# Patient Record
Sex: Female | Born: 1960 | Race: White | Hispanic: No | Marital: Married | Smoking: Former smoker
Health system: Southern US, Community
[De-identification: ages and names within clinical notes are randomized; demographics above are authoritative.]

## PROBLEM LIST (undated history)

## (undated) DIAGNOSIS — M069 Rheumatoid arthritis, unspecified: Secondary | ICD-10-CM

## (undated) HISTORY — DX: Rheumatoid arthritis, unspecified: M06.9

---

## 1986-08-25 HISTORY — PX: BREAST LUMPECTOMY: SHX2

## 2004-07-03 ENCOUNTER — Ambulatory Visit: Payer: Self-pay | Admitting: Family Medicine

## 2015-07-03 ENCOUNTER — Ambulatory Visit: Payer: BLUE CROSS/BLUE SHIELD | Attending: Rheumatology

## 2015-07-03 DIAGNOSIS — M069 Rheumatoid arthritis, unspecified: Secondary | ICD-10-CM

## 2015-07-03 DIAGNOSIS — G894 Chronic pain syndrome: Secondary | ICD-10-CM | POA: Insufficient documentation

## 2015-07-03 NOTE — Patient Instructions (Signed)
She was asked to  not over exert for the next couple of days but to stay active

## 2015-07-03 NOTE — Therapy (Signed)
[place], [place], [address] Phone: [phone]   Fax:  [phone]  Physical Therapy Evaluation  Patient Details  Name: Natalie Osborn MRN: [id number] Date of Birth: 10-05-1960 No Data Recorded  Encounter Date: 07/03/2015      PT End of Session - 07/03/15 1723    Visit Number 1   Number of Visits 1   PT Start Time 0132   PT Stop Time 0510   PT Time Calculation (min) 218 min   Activity Tolerance Patient limited by pain;Patient tolerated treatment well   Behavior During Therapy [facility] for tasks assessed/performed      No past medical history on file.  No past surgical history on file.  There were no vitals filed for this visit.  Visit Diagnosis:  Chronic pain syndrome - Plan: PT plan of care cert/re-cert  Rheumatoid arthritis involving both knees, unspecified rheumatoid factor presence ([place]) - Plan: PT plan of care cert/re-cert  Rheumatoid arthritis involving both hands, unspecified rheumatoid factor presence ([place]) - Plan: PT plan of care cert/re-cert      Subjective Assessment - 07/03/15 1728    Subjective See FCE report   Currently in Pain? Yes  See FCE report   Multiple Pain Sites Yes  See FCE report                                           Plan - 07/03/15 1727    Clinical Impression Statement Ms Dishman completed the FCE process with work rating of SEDENTARY. Report to be faxed to Dr Vickey Huger and Agree with Plan of Care Patient         Problem List There are no active problems to display for this patient.   Darrel Hoover PT 07/03/2015, 5:32 PM  [facility] [address] [place], [place], [address] Phone: [phone]   Fax:  [phone]  Name: Natalie Osborn MRN: [id number] Date of Birth: 1960-09-25

## 2016-04-23 ENCOUNTER — Other Ambulatory Visit: Payer: Self-pay | Admitting: Rheumatology

## 2016-04-23 DIAGNOSIS — R911 Solitary pulmonary nodule: Secondary | ICD-10-CM

## 2016-05-01 ENCOUNTER — Ambulatory Visit
Admission: RE | Admit: 2016-05-01 | Discharge: 2016-05-01 | Disposition: A | Discharge status: Home / Self Care | Payer: BLUE CROSS/BLUE SHIELD | Source: Ambulatory Visit | Attending: Rheumatology | Admitting: Rheumatology

## 2016-05-01 DIAGNOSIS — R911 Solitary pulmonary nodule: Secondary | ICD-10-CM

## 2016-05-06 ENCOUNTER — Encounter: Payer: Self-pay | Admitting: Pulmonary Disease

## 2016-05-06 ENCOUNTER — Ambulatory Visit (INDEPENDENT_AMBULATORY_CARE_PROVIDER_SITE_OTHER): Payer: BLUE CROSS/BLUE SHIELD | Admitting: Pulmonary Disease

## 2016-05-06 DIAGNOSIS — R918 Other nonspecific abnormal finding of lung field: Secondary | ICD-10-CM | POA: Diagnosis not present

## 2016-05-06 NOTE — Assessment & Plan Note (Signed)
Clotine and I reviewed the images of her CT chest today which showed multiple pulmonary nodules, the largest of which was 6 mm in the right lower lobe.  The differential diagnosis of nodules is broad and includes many different benign and malignant etiologies. The majority of nodules that are less than a centimeter, round and smooth like hers are in fact benign so hopefully that will be the case for her. However, she did smoke for a number of years so her likelihood of having lung cancer is slightly elevated.  I think this most likely represents an atypical infection of some sort or a result of a prior infection. She tells me that she's had multiple TB test collected in the past which have been negative.  Plan: Repeat CT scan of the chest in 6 months and follow up afterwards Come back to clini soonerc if she develops any sort of respiratory complaints like chest congestion, hemoptysis, weight loss, or chest pain.

## 2016-05-06 NOTE — Progress Notes (Signed)
Subjective:    Patient ID: Natalie Osborn, female    DOB: 23-Jul-1961, 55 y.o.   MRN: [id number]  HPI Chief Complaint  Patient presents with  . Advice Only    referred by Dr. Dossie Der for pulm nodule.     [name] was sent to me to evaluate a pulmonary nodule.  She was recently being seen for evaluation for a clinical trial for her rhematoid arthritis and she ended up having a CXR that showed a nodule.    She has never been told she had a lung problem in adulthood or childhood.  She has had some hoarseness which she thinks is related to recurrent allergic rhinitis.  She notes some mucus in her throat.  She has coughed some mucus in the last year, in the mornings primarily.  She used to work in a [employer].    She doesn't exercise but she notes that she has no dyspnea.  In the last year her rheumatoid arthritis have been severe despite weekly Humira injections.  Specifically swelling and soreness of her knees and feet and hands.  She had a lot of fatigue and chills.  This was most severe in the late evenings.    She has smoked as much as 1PPD, quit over 20 years ago.  She started smoking in her 40's and quit in her 46's.    Past Medical History:  Diagnosis Date  . Rheumatoid arthritis ([place])      Family History  Problem Relation Age of Onset  . Diabetes Mother   . Cancer Father   . Other Brother     Crypto Meningitis     Social History   Social History  . Marital status: Married    Spouse name: N/A  . Number of children: N/A  . Years of education: N/A   Occupational History  . Not on file.   Social History Main Topics  . Smoking status: Former Smoker    Packs/day: 1.00    Years: 7.00    Types: Cigarettes    Quit date: 05/06/1994  . Smokeless tobacco: Never Used  . Alcohol use Not on file  . Drug use: Unknown  . Sexual activity: Not on file   Other Topics Concern  . Not on file   Social History Narrative  . No narrative on file     No Known Allergies    Outpatient Medications Prior to Visit  Medication Sig Dispense Refill  . calcium carbonate 1250 MG capsule Take 1,250 mg by mouth 2 (two) times daily with a meal.    . ferrous fumarate (HEMOCYTE - 106 MG FE) 325 (106 FE) MG TABS tablet Take 1 tablet by mouth.    Marland Kitchen omeprazole (PRILOSEC) 40 MG capsule Take 40 mg by mouth daily.    . methotrexate (RHEUMATREX) 2.5 MG tablet Take 7.5 mg by mouth 3 (three) times a week.    . predniSONE (DELTASONE) 5 MG tablet Take 5 mg by mouth daily with breakfast.     No facility-administered medications prior to visit.      Review of Systems  Constitutional: Negative for fever and unexpected weight change.  HENT: Negative for congestion, dental problem, ear pain, nosebleeds, postnasal drip, rhinorrhea, sinus pressure, sneezing, sore throat and trouble swallowing.   Eyes: Negative for redness and itching.  Respiratory: Negative for cough, chest tightness, shortness of breath and wheezing.   Cardiovascular: Negative for palpitations and leg swelling.  Gastrointestinal: Negative for nausea and vomiting.  Genitourinary: Negative  for dysuria.  Musculoskeletal: Negative for joint swelling.  Skin: Negative for rash.  Neurological: Negative for headaches.  Hematological: Does not bruise/bleed easily.  Psychiatric/Behavioral: Negative for dysphoric mood. The patient is not nervous/anxious.        Objective:   Physical Exam Vitals:   05/06/16 1358  BP: 120/72  Pulse: 76  SpO2: 97%  Weight: 151 lb (68.5 kg)  Height: 5\' 5"  (1.651 m)  RA  Gen: well appearing, no acute distress HENT: NCAT, OP clear, neck supple without masses Eyes: PERRL, EOMi Lymph: no cervical lymphadenopathy PULM: CTA B CV: RRR, no mgr, no JVD GI: BS+, soft, nontender, no hsm Derm: no rash or skin breakdown MSK: normal bulk and tone Neuro: A&Ox4, CN II-XII intact, strength 5/5 in all 4 extremities Psyche: normal mood and affect   Records from rheumatology August 2017  reviewed where she was evaluated for a rheumatoid arthritis clinical trial, she takes methotrexate and Humira. She had a chest x-ray ordered which showed a pulmonary nodule  05/01/2016 CT scan of the chest images personally reviewed showing totally normal pulmonary parenchyma with the exception of several small nodules bilaterally, the largest of which is 6 mm in the right lower lobe    Assessment & Plan:  Multiple pulmonary nodules Marria and I reviewed the images of her CT chest today which showed multiple pulmonary nodules, the largest of which was 6 mm in the right lower lobe.  The differential diagnosis of nodules is broad and includes many different benign and malignant etiologies. The majority of nodules that are less than a centimeter, round and smooth like hers are in fact benign so hopefully that will be the case for her. However, she did smoke for a number of years so her likelihood of having lung cancer is slightly elevated.  I think this most likely represents an atypical infection of some sort or a result of a prior infection. She tells me that she's had multiple TB test collected in the past which have been negative.  Plan: Repeat CT scan of the chest in 6 months and follow up afterwards Come back to clini soonerc if she develops any sort of respiratory complaints like chest congestion, hemoptysis, weight loss, or chest pain.    Current Outpatient Prescriptions:  .  calcium carbonate 1250 MG capsule, Take 1,250 mg by mouth 2 (two) times daily with a meal., Disp: , Rfl:  .  ferrous fumarate (HEMOCYTE - 106 MG FE) 325 (106 FE) MG TABS tablet, Take 1 tablet by mouth., Disp: , Rfl:  .  folic acid (FOLVITE) 1 MG tablet, Take 2 mg by mouth 2 (two) times daily., Disp: , Rfl:  .  methotrexate (50 MG/ML) 1 g injection, Inject 0.8 mg into the vein once a week., Disp: , Rfl:  .  omeprazole (PRILOSEC) 40 MG capsule, Take 40 mg by mouth daily., Disp: , Rfl:

## 2016-05-06 NOTE — Patient Instructions (Signed)
We will arrange for a lung CT scan in March 2017 and then see you after that If you have any sort of problems breathing, cough with chest congestion, chest pain, or weight loss or coughing up blood please contact us as soon as possible

## 2016-05-06 NOTE — Addendum Note (Signed)
Addended by: Len Blalock on: 05/06/2016 02:57 PM   Modules accepted: Orders

## 2016-06-02 ENCOUNTER — Encounter: Payer: Self-pay | Admitting: Rheumatology

## 2016-10-13 ENCOUNTER — Telehealth: Payer: Self-pay | Admitting: Pulmonary Disease

## 2016-10-13 NOTE — Telephone Encounter (Signed)
FYI - BQ put in order for pt to have CT in March & to follow up with him after.  I scheduled pt appt for CT & when I called to give her appt info she stated she did not want to do at this time.  She stated she would call us back when she is ready.

## 2016-10-14 NOTE — Telephone Encounter (Signed)
noted 

## 2016-10-30 ENCOUNTER — Other Ambulatory Visit: Payer: BLUE CROSS/BLUE SHIELD

## 2017-09-09 IMAGING — CT CT CHEST W/O CM
2 of 4 series · 15 of 36 positions shown, 18 images · non-contrast
Comparison: None available currently.

CLINICAL DATA: Right pulmonary nodule.

EXAM:
CT CHEST WITHOUT CONTRAST
TECHNIQUE: Multidetector CT imaging of the chest was performed following the
standard protocol without IV contrast.

[Series 2: chest w/(date) · axial · 0.64mm/px · z∈[+498,+762]mm · 12 of 156 slices shown, 15 images]
[im 12/156  mediastinal]
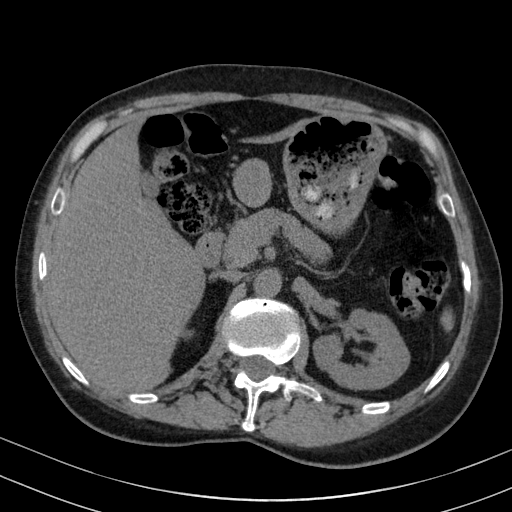
[im 12/156  lung]
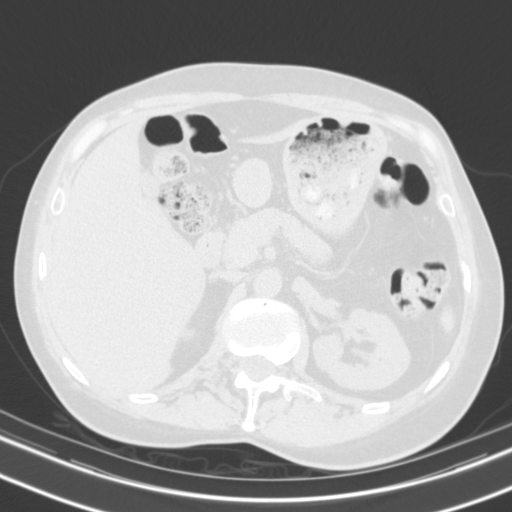
[im 23/156  lung]
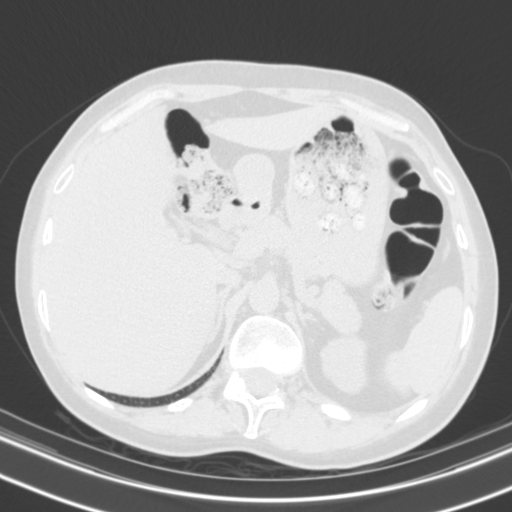
[im 34/156  lung]
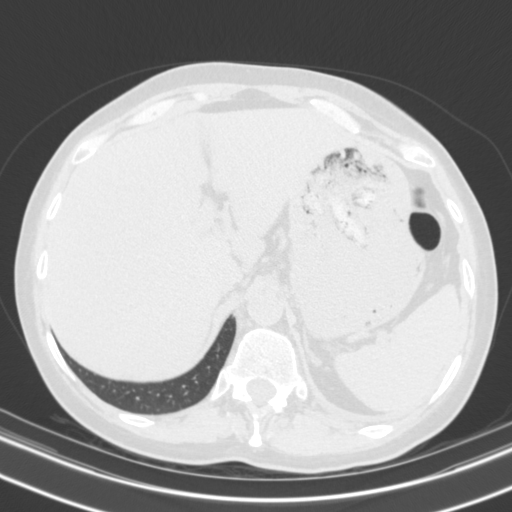
[im 45/156  lung]
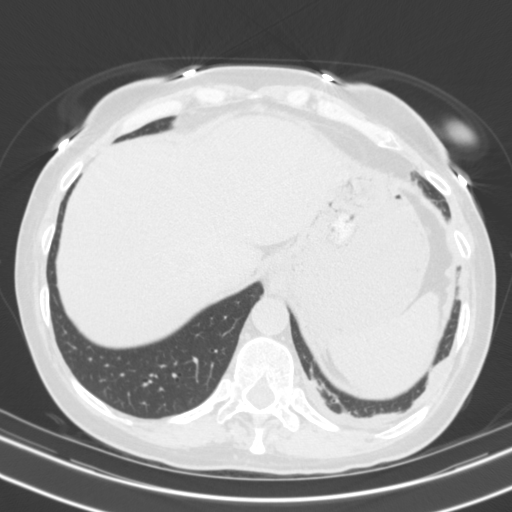
[im 56/156  mediastinal]
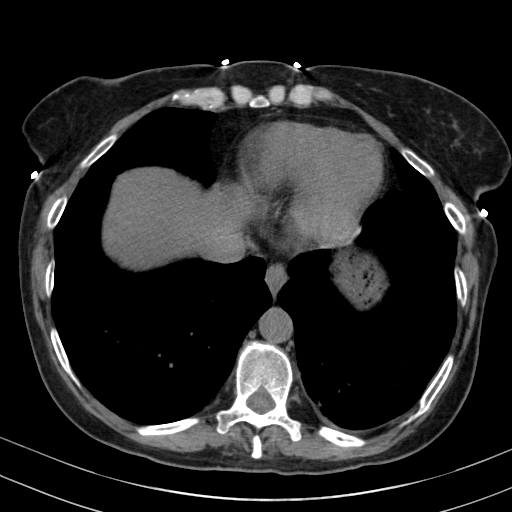
[im 56/156  lung]
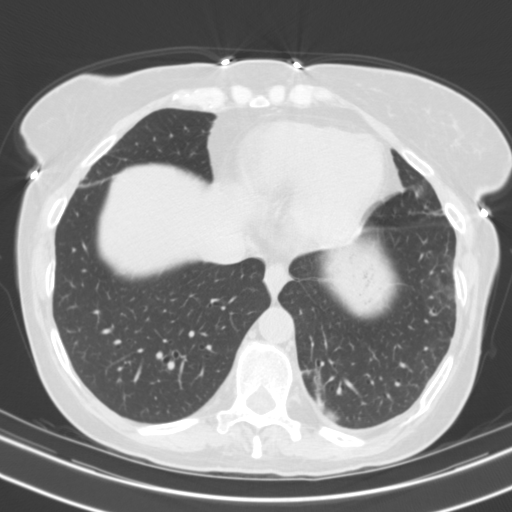
[im 67/156  lung]
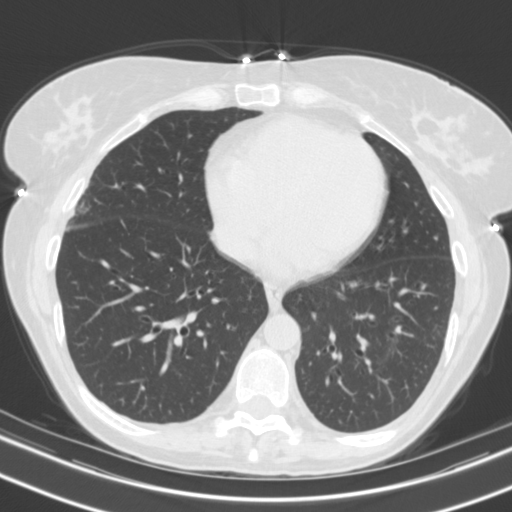
[im 89/156  lung]
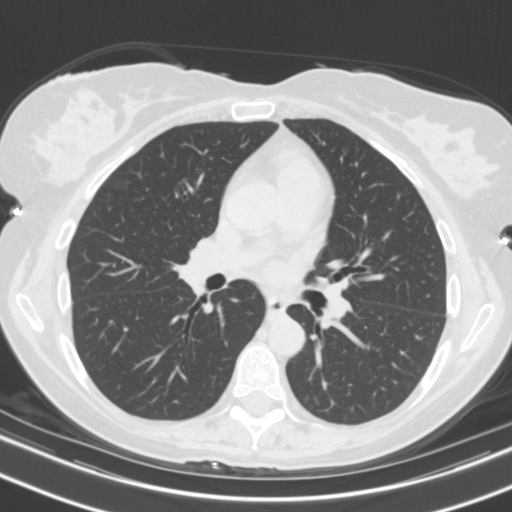
[im 100/156  lung]
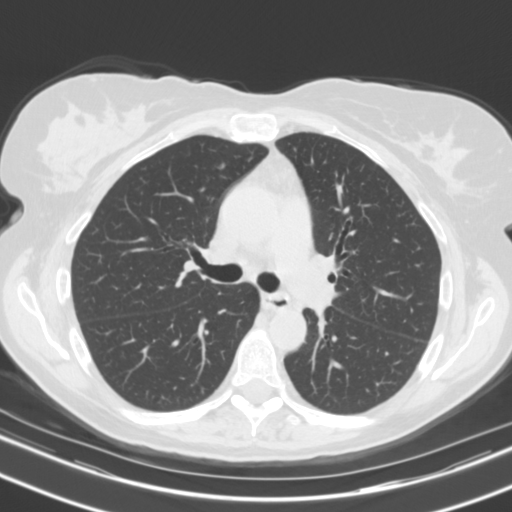
[im 111/156  mediastinal]
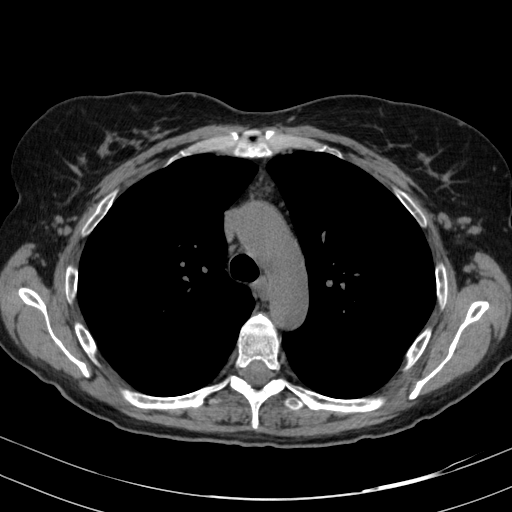
[im 111/156  lung]
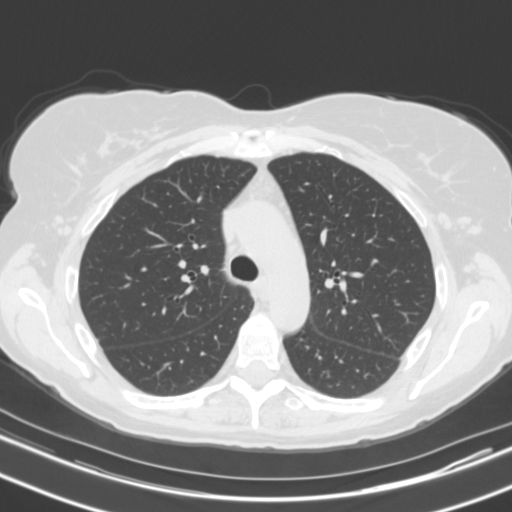
[im 122/156  lung]
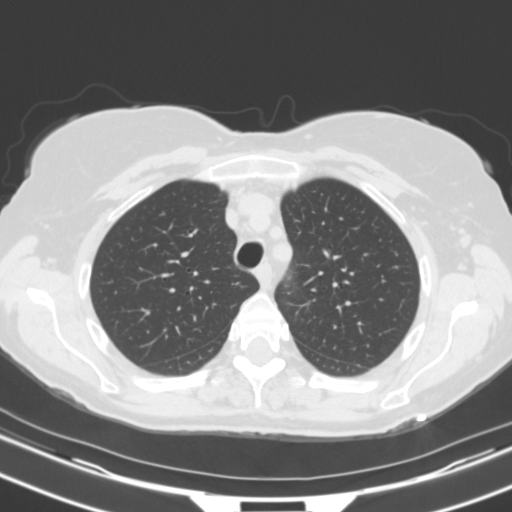
[im 133/156  lung]
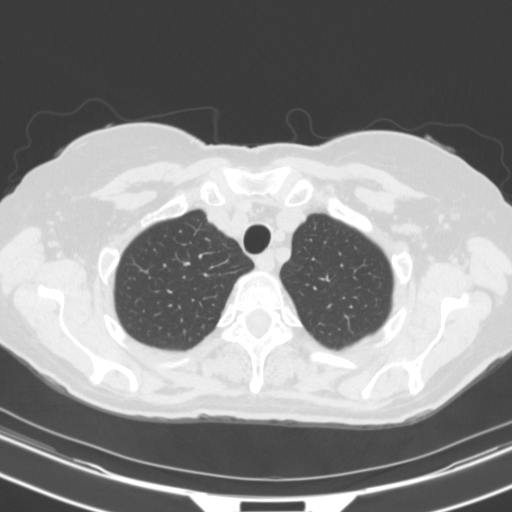
[im 144/156  lung]
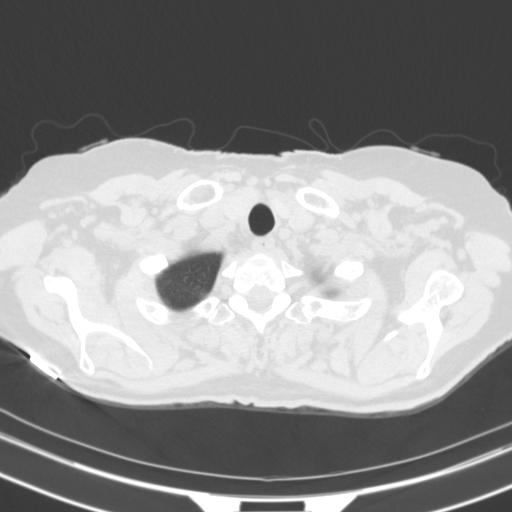

[Series 3: cor · coronal · 0.61mm/px · 3 of 126 slices shown]
[im 26/126  lung]
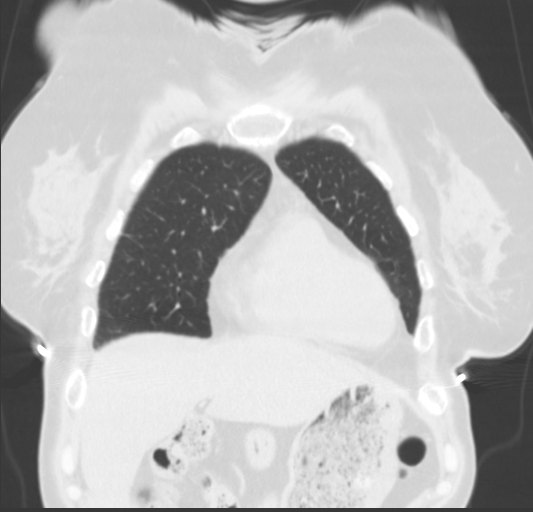
[im 51/126  lung]
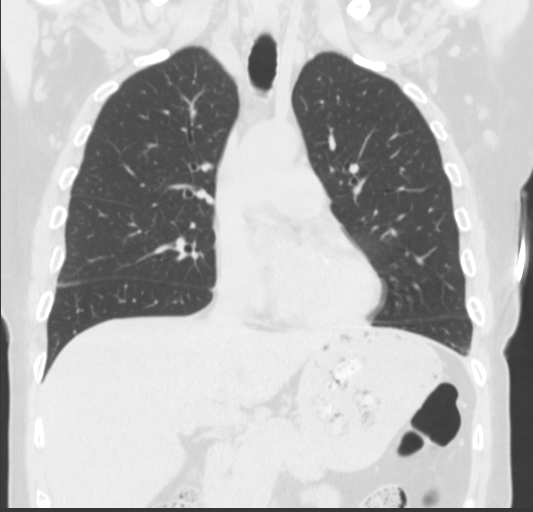
[im 76/126  lung]
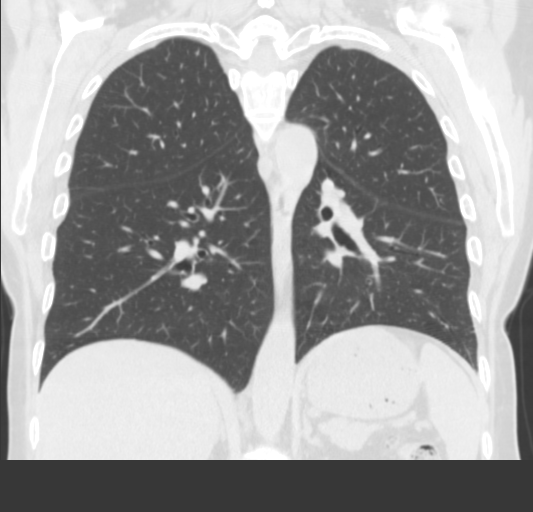

[15 of 36 positions shown; findings below may reference images not displayed]

FINDINGS: Cardiovascular: No abnormality seen.

Mediastinum/Nodes: No mediastinal mass is noted. Mildly enlarged
bilateral axillary adenopathy is noted which most likely is
inflammatory or reactive in etiology.

Lungs/Pleura: No pneumothorax or pleural effusion is noted. 5 mm
subpleural nodule is seen along left upper lobe best seen on image
number 74 of series 5. 4 mm nodule is seen laterally in right middle
lobe best seen on image number 71 of series 5. 6 mm subpleural
nodule is noted posteriorly in right lower lobe best seen on image
number 82 of series 5. 4 mm nodule is noted in right upper lobe
anteriorly best seen on image number 40 of series 5. Pleural
thickening is noted posteriorly in the left lung base most
consistent with scarring.

Upper Abdomen: No definite abnormality seen in the visualized
portion of upper abdomen.

Musculoskeletal: Old healed sternal fracture is noted. No acute
osseous abnormality is noted.
IMPRESSION: Multiple pulmonary nodules are noted bilaterally, with the largest
being 6 mm subpleural nodule posteriorly in right lower lobe.
Non-contrast chest CT at 3-6 months is recommended. If the nodules
are stable at time of repeat CT, then future CT at 18-24 months
(from today's scan) is considered optional for low-risk patients,
but is recommended for high-risk patients. This recommendation
follows the consensus statement: Guidelines for Management of
Incidental Pulmonary Nodules Detected on CT Images:From the
[HOSPITAL] 5881; published online before print
(10.1148/radiol.4787808022).

## 2018-01-19 DIAGNOSIS — R69 Illness, unspecified: Secondary | ICD-10-CM | POA: Diagnosis not present

## 2018-01-21 DIAGNOSIS — K219 Gastro-esophageal reflux disease without esophagitis: Secondary | ICD-10-CM | POA: Diagnosis not present

## 2018-01-21 DIAGNOSIS — M069 Rheumatoid arthritis, unspecified: Secondary | ICD-10-CM | POA: Diagnosis not present

## 2018-01-21 DIAGNOSIS — G8929 Other chronic pain: Secondary | ICD-10-CM | POA: Diagnosis not present

## 2018-01-21 DIAGNOSIS — M1990 Unspecified osteoarthritis, unspecified site: Secondary | ICD-10-CM | POA: Diagnosis not present

## 2018-01-21 DIAGNOSIS — K5900 Constipation, unspecified: Secondary | ICD-10-CM | POA: Diagnosis not present

## 2018-01-21 DIAGNOSIS — Z791 Long term (current) use of non-steroidal anti-inflammatories (NSAID): Secondary | ICD-10-CM | POA: Diagnosis not present

## 2018-01-21 DIAGNOSIS — Z6831 Body mass index (BMI) 31.0-31.9, adult: Secondary | ICD-10-CM | POA: Diagnosis not present

## 2018-01-21 DIAGNOSIS — E669 Obesity, unspecified: Secondary | ICD-10-CM | POA: Diagnosis not present

## 2018-01-21 DIAGNOSIS — J309 Allergic rhinitis, unspecified: Secondary | ICD-10-CM | POA: Diagnosis not present

## 2018-01-21 DIAGNOSIS — I87329 Chronic venous hypertension (idiopathic) with inflammation of unspecified lower extremity: Secondary | ICD-10-CM | POA: Diagnosis not present

## 2018-01-25 DIAGNOSIS — Z79899 Other long term (current) drug therapy: Secondary | ICD-10-CM | POA: Diagnosis not present

## 2018-01-25 DIAGNOSIS — M1990 Unspecified osteoarthritis, unspecified site: Secondary | ICD-10-CM | POA: Diagnosis not present

## 2018-01-25 DIAGNOSIS — M79643 Pain in unspecified hand: Secondary | ICD-10-CM | POA: Diagnosis not present

## 2018-01-25 DIAGNOSIS — M0589 Other rheumatoid arthritis with rheumatoid factor of multiple sites: Secondary | ICD-10-CM | POA: Diagnosis not present

## 2018-02-02 DIAGNOSIS — I8312 Varicose veins of left lower extremity with inflammation: Secondary | ICD-10-CM | POA: Diagnosis not present

## 2018-02-02 DIAGNOSIS — I8311 Varicose veins of right lower extremity with inflammation: Secondary | ICD-10-CM | POA: Diagnosis not present

## 2018-02-02 DIAGNOSIS — I83813 Varicose veins of bilateral lower extremities with pain: Secondary | ICD-10-CM | POA: Diagnosis not present

## 2018-02-08 DIAGNOSIS — R69 Illness, unspecified: Secondary | ICD-10-CM | POA: Diagnosis not present

## 2018-02-08 DIAGNOSIS — R062 Wheezing: Secondary | ICD-10-CM | POA: Diagnosis not present

## 2018-02-08 DIAGNOSIS — R05 Cough: Secondary | ICD-10-CM | POA: Diagnosis not present

## 2018-03-03 DIAGNOSIS — I8312 Varicose veins of left lower extremity with inflammation: Secondary | ICD-10-CM | POA: Diagnosis not present

## 2018-03-03 DIAGNOSIS — I8311 Varicose veins of right lower extremity with inflammation: Secondary | ICD-10-CM | POA: Diagnosis not present

## 2018-03-03 DIAGNOSIS — I83813 Varicose veins of bilateral lower extremities with pain: Secondary | ICD-10-CM | POA: Diagnosis not present

## 2018-03-18 DIAGNOSIS — M1990 Unspecified osteoarthritis, unspecified site: Secondary | ICD-10-CM | POA: Diagnosis not present

## 2018-03-18 DIAGNOSIS — Z79899 Other long term (current) drug therapy: Secondary | ICD-10-CM | POA: Diagnosis not present

## 2018-03-18 DIAGNOSIS — M5441 Lumbago with sciatica, right side: Secondary | ICD-10-CM | POA: Diagnosis not present

## 2018-03-18 DIAGNOSIS — M79643 Pain in unspecified hand: Secondary | ICD-10-CM | POA: Diagnosis not present

## 2018-03-18 DIAGNOSIS — M0589 Other rheumatoid arthritis with rheumatoid factor of multiple sites: Secondary | ICD-10-CM | POA: Diagnosis not present

## 2018-03-18 DIAGNOSIS — R21 Rash and other nonspecific skin eruption: Secondary | ICD-10-CM | POA: Diagnosis not present

## 2018-03-24 DIAGNOSIS — Z6830 Body mass index (BMI) 30.0-30.9, adult: Secondary | ICD-10-CM | POA: Diagnosis not present

## 2018-03-24 DIAGNOSIS — R300 Dysuria: Secondary | ICD-10-CM | POA: Diagnosis not present

## 2018-04-06 DIAGNOSIS — Z792 Long term (current) use of antibiotics: Secondary | ICD-10-CM | POA: Diagnosis not present

## 2018-04-06 DIAGNOSIS — M869 Osteomyelitis, unspecified: Secondary | ICD-10-CM | POA: Diagnosis not present

## 2018-04-06 DIAGNOSIS — R109 Unspecified abdominal pain: Secondary | ICD-10-CM | POA: Diagnosis not present

## 2018-04-06 DIAGNOSIS — N3080 Other cystitis without hematuria: Secondary | ICD-10-CM | POA: Diagnosis not present

## 2018-04-06 DIAGNOSIS — N739 Female pelvic inflammatory disease, unspecified: Secondary | ICD-10-CM | POA: Diagnosis not present

## 2018-04-06 DIAGNOSIS — M8618 Other acute osteomyelitis, other site: Secondary | ICD-10-CM | POA: Diagnosis not present

## 2018-04-06 DIAGNOSIS — B9561 Methicillin susceptible Staphylococcus aureus infection as the cause of diseases classified elsewhere: Secondary | ICD-10-CM | POA: Diagnosis not present

## 2018-04-06 DIAGNOSIS — M5127 Other intervertebral disc displacement, lumbosacral region: Secondary | ICD-10-CM | POA: Diagnosis not present

## 2018-04-06 DIAGNOSIS — Z452 Encounter for adjustment and management of vascular access device: Secondary | ICD-10-CM | POA: Diagnosis not present

## 2018-04-06 DIAGNOSIS — N3000 Acute cystitis without hematuria: Secondary | ICD-10-CM | POA: Diagnosis not present

## 2018-04-06 DIAGNOSIS — N3090 Cystitis, unspecified without hematuria: Secondary | ICD-10-CM | POA: Diagnosis not present

## 2018-04-06 DIAGNOSIS — R000 Tachycardia, unspecified: Secondary | ICD-10-CM | POA: Diagnosis not present

## 2018-04-06 DIAGNOSIS — M545 Low back pain: Secondary | ICD-10-CM | POA: Diagnosis not present

## 2018-04-06 DIAGNOSIS — M009 Pyogenic arthritis, unspecified: Secondary | ICD-10-CM | POA: Diagnosis not present

## 2018-04-06 DIAGNOSIS — A4101 Sepsis due to Methicillin susceptible Staphylococcus aureus: Secondary | ICD-10-CM | POA: Diagnosis not present

## 2018-04-06 DIAGNOSIS — R102 Pelvic and perineal pain: Secondary | ICD-10-CM | POA: Diagnosis not present

## 2018-04-06 DIAGNOSIS — E871 Hypo-osmolality and hyponatremia: Secondary | ICD-10-CM | POA: Diagnosis not present

## 2018-04-06 DIAGNOSIS — M8448XA Pathological fracture, other site, initial encounter for fracture: Secondary | ICD-10-CM | POA: Diagnosis not present

## 2018-04-06 DIAGNOSIS — N738 Other specified female pelvic inflammatory diseases: Secondary | ICD-10-CM | POA: Diagnosis not present

## 2018-04-06 DIAGNOSIS — B9689 Other specified bacterial agents as the cause of diseases classified elsewhere: Secondary | ICD-10-CM | POA: Diagnosis not present

## 2018-04-07 DIAGNOSIS — N739 Female pelvic inflammatory disease, unspecified: Secondary | ICD-10-CM

## 2018-04-12 DIAGNOSIS — N3090 Cystitis, unspecified without hematuria: Secondary | ICD-10-CM | POA: Diagnosis not present

## 2018-04-12 DIAGNOSIS — M869 Osteomyelitis, unspecified: Secondary | ICD-10-CM | POA: Diagnosis not present

## 2018-04-13 DIAGNOSIS — N3090 Cystitis, unspecified without hematuria: Secondary | ICD-10-CM | POA: Diagnosis not present

## 2018-04-13 DIAGNOSIS — Z87891 Personal history of nicotine dependence: Secondary | ICD-10-CM | POA: Diagnosis not present

## 2018-04-13 DIAGNOSIS — M069 Rheumatoid arthritis, unspecified: Secondary | ICD-10-CM | POA: Diagnosis not present

## 2018-04-13 DIAGNOSIS — B9561 Methicillin susceptible Staphylococcus aureus infection as the cause of diseases classified elsewhere: Secondary | ICD-10-CM | POA: Diagnosis not present

## 2018-04-13 DIAGNOSIS — D473 Essential (hemorrhagic) thrombocythemia: Secondary | ICD-10-CM | POA: Diagnosis not present

## 2018-04-13 DIAGNOSIS — M0080 Arthritis due to other bacteria, unspecified joint: Secondary | ICD-10-CM | POA: Diagnosis not present

## 2018-04-13 DIAGNOSIS — Z452 Encounter for adjustment and management of vascular access device: Secondary | ICD-10-CM | POA: Diagnosis not present

## 2018-04-13 DIAGNOSIS — S3210XD Unspecified fracture of sacrum, subsequent encounter for fracture with routine healing: Secondary | ICD-10-CM | POA: Diagnosis not present

## 2018-04-13 DIAGNOSIS — N739 Female pelvic inflammatory disease, unspecified: Secondary | ICD-10-CM | POA: Diagnosis not present

## 2018-04-13 DIAGNOSIS — M869 Osteomyelitis, unspecified: Secondary | ICD-10-CM | POA: Diagnosis not present

## 2018-04-14 DIAGNOSIS — M869 Osteomyelitis, unspecified: Secondary | ICD-10-CM | POA: Diagnosis not present

## 2018-04-14 DIAGNOSIS — N3090 Cystitis, unspecified without hematuria: Secondary | ICD-10-CM | POA: Diagnosis not present

## 2018-04-15 DIAGNOSIS — M869 Osteomyelitis, unspecified: Secondary | ICD-10-CM | POA: Diagnosis not present

## 2018-04-15 DIAGNOSIS — N3090 Cystitis, unspecified without hematuria: Secondary | ICD-10-CM | POA: Diagnosis not present

## 2018-04-16 DIAGNOSIS — M8580 Other specified disorders of bone density and structure, unspecified site: Secondary | ICD-10-CM | POA: Diagnosis not present

## 2018-04-16 DIAGNOSIS — M869 Osteomyelitis, unspecified: Secondary | ICD-10-CM | POA: Diagnosis not present

## 2018-04-16 DIAGNOSIS — N3090 Cystitis, unspecified without hematuria: Secondary | ICD-10-CM | POA: Diagnosis not present

## 2018-04-16 DIAGNOSIS — Z6830 Body mass index (BMI) 30.0-30.9, adult: Secondary | ICD-10-CM | POA: Diagnosis not present

## 2018-04-17 DIAGNOSIS — M869 Osteomyelitis, unspecified: Secondary | ICD-10-CM | POA: Diagnosis not present

## 2018-04-17 DIAGNOSIS — N3090 Cystitis, unspecified without hematuria: Secondary | ICD-10-CM | POA: Diagnosis not present

## 2018-04-18 DIAGNOSIS — M869 Osteomyelitis, unspecified: Secondary | ICD-10-CM | POA: Diagnosis not present

## 2018-04-18 DIAGNOSIS — N3090 Cystitis, unspecified without hematuria: Secondary | ICD-10-CM | POA: Diagnosis not present

## 2018-04-19 DIAGNOSIS — M869 Osteomyelitis, unspecified: Secondary | ICD-10-CM | POA: Diagnosis not present

## 2018-04-19 DIAGNOSIS — Z79899 Other long term (current) drug therapy: Secondary | ICD-10-CM | POA: Diagnosis not present

## 2018-04-19 DIAGNOSIS — N3090 Cystitis, unspecified without hematuria: Secondary | ICD-10-CM | POA: Diagnosis not present

## 2018-04-20 DIAGNOSIS — N3090 Cystitis, unspecified without hematuria: Secondary | ICD-10-CM | POA: Diagnosis not present

## 2018-04-20 DIAGNOSIS — M869 Osteomyelitis, unspecified: Secondary | ICD-10-CM | POA: Diagnosis not present

## 2018-04-21 DIAGNOSIS — N3090 Cystitis, unspecified without hematuria: Secondary | ICD-10-CM | POA: Diagnosis not present

## 2018-04-21 DIAGNOSIS — M869 Osteomyelitis, unspecified: Secondary | ICD-10-CM | POA: Diagnosis not present

## 2018-04-22 DIAGNOSIS — M869 Osteomyelitis, unspecified: Secondary | ICD-10-CM | POA: Diagnosis not present

## 2018-04-22 DIAGNOSIS — N3090 Cystitis, unspecified without hematuria: Secondary | ICD-10-CM | POA: Diagnosis not present

## 2018-04-23 DIAGNOSIS — N3090 Cystitis, unspecified without hematuria: Secondary | ICD-10-CM | POA: Diagnosis not present

## 2018-04-23 DIAGNOSIS — M869 Osteomyelitis, unspecified: Secondary | ICD-10-CM | POA: Diagnosis not present

## 2018-04-24 DIAGNOSIS — N3090 Cystitis, unspecified without hematuria: Secondary | ICD-10-CM | POA: Diagnosis not present

## 2018-04-24 DIAGNOSIS — M869 Osteomyelitis, unspecified: Secondary | ICD-10-CM | POA: Diagnosis not present

## 2018-04-25 DIAGNOSIS — N3090 Cystitis, unspecified without hematuria: Secondary | ICD-10-CM | POA: Diagnosis not present

## 2018-04-25 DIAGNOSIS — M869 Osteomyelitis, unspecified: Secondary | ICD-10-CM | POA: Diagnosis not present

## 2018-04-26 DIAGNOSIS — N3090 Cystitis, unspecified without hematuria: Secondary | ICD-10-CM | POA: Diagnosis not present

## 2018-04-26 DIAGNOSIS — M869 Osteomyelitis, unspecified: Secondary | ICD-10-CM | POA: Diagnosis not present

## 2018-04-27 DIAGNOSIS — N3090 Cystitis, unspecified without hematuria: Secondary | ICD-10-CM | POA: Diagnosis not present

## 2018-04-27 DIAGNOSIS — M869 Osteomyelitis, unspecified: Secondary | ICD-10-CM | POA: Diagnosis not present

## 2018-04-27 DIAGNOSIS — Z792 Long term (current) use of antibiotics: Secondary | ICD-10-CM | POA: Diagnosis not present

## 2018-04-28 DIAGNOSIS — N3090 Cystitis, unspecified without hematuria: Secondary | ICD-10-CM | POA: Diagnosis not present

## 2018-04-28 DIAGNOSIS — M869 Osteomyelitis, unspecified: Secondary | ICD-10-CM | POA: Diagnosis not present

## 2018-04-29 DIAGNOSIS — M869 Osteomyelitis, unspecified: Secondary | ICD-10-CM | POA: Diagnosis not present

## 2018-04-29 DIAGNOSIS — N3090 Cystitis, unspecified without hematuria: Secondary | ICD-10-CM | POA: Diagnosis not present

## 2018-04-30 DIAGNOSIS — M869 Osteomyelitis, unspecified: Secondary | ICD-10-CM | POA: Diagnosis not present

## 2018-04-30 DIAGNOSIS — N3090 Cystitis, unspecified without hematuria: Secondary | ICD-10-CM | POA: Diagnosis not present

## 2018-05-01 DIAGNOSIS — M869 Osteomyelitis, unspecified: Secondary | ICD-10-CM | POA: Diagnosis not present

## 2018-05-01 DIAGNOSIS — N3090 Cystitis, unspecified without hematuria: Secondary | ICD-10-CM | POA: Diagnosis not present

## 2018-05-02 DIAGNOSIS — M869 Osteomyelitis, unspecified: Secondary | ICD-10-CM | POA: Diagnosis not present

## 2018-05-02 DIAGNOSIS — N3090 Cystitis, unspecified without hematuria: Secondary | ICD-10-CM | POA: Diagnosis not present

## 2018-05-03 DIAGNOSIS — M869 Osteomyelitis, unspecified: Secondary | ICD-10-CM | POA: Diagnosis not present

## 2018-05-03 DIAGNOSIS — N3090 Cystitis, unspecified without hematuria: Secondary | ICD-10-CM | POA: Diagnosis not present

## 2018-05-04 DIAGNOSIS — S3210XD Unspecified fracture of sacrum, subsequent encounter for fracture with routine healing: Secondary | ICD-10-CM | POA: Diagnosis not present

## 2018-05-04 DIAGNOSIS — N739 Female pelvic inflammatory disease, unspecified: Secondary | ICD-10-CM | POA: Diagnosis not present

## 2018-05-04 DIAGNOSIS — M0080 Arthritis due to other bacteria, unspecified joint: Secondary | ICD-10-CM | POA: Diagnosis not present

## 2018-05-04 DIAGNOSIS — B9561 Methicillin susceptible Staphylococcus aureus infection as the cause of diseases classified elsewhere: Secondary | ICD-10-CM | POA: Diagnosis not present

## 2018-05-04 DIAGNOSIS — Z792 Long term (current) use of antibiotics: Secondary | ICD-10-CM | POA: Diagnosis not present

## 2018-05-04 DIAGNOSIS — Z452 Encounter for adjustment and management of vascular access device: Secondary | ICD-10-CM | POA: Diagnosis not present

## 2018-05-04 DIAGNOSIS — N3090 Cystitis, unspecified without hematuria: Secondary | ICD-10-CM | POA: Diagnosis not present

## 2018-05-04 DIAGNOSIS — M069 Rheumatoid arthritis, unspecified: Secondary | ICD-10-CM | POA: Diagnosis not present

## 2018-05-04 DIAGNOSIS — M869 Osteomyelitis, unspecified: Secondary | ICD-10-CM | POA: Diagnosis not present

## 2018-05-04 DIAGNOSIS — D473 Essential (hemorrhagic) thrombocythemia: Secondary | ICD-10-CM | POA: Diagnosis not present

## 2018-05-04 DIAGNOSIS — Z87891 Personal history of nicotine dependence: Secondary | ICD-10-CM | POA: Diagnosis not present

## 2018-05-05 DIAGNOSIS — N3090 Cystitis, unspecified without hematuria: Secondary | ICD-10-CM | POA: Diagnosis not present

## 2018-05-05 DIAGNOSIS — M869 Osteomyelitis, unspecified: Secondary | ICD-10-CM | POA: Diagnosis not present

## 2018-05-06 DIAGNOSIS — M869 Osteomyelitis, unspecified: Secondary | ICD-10-CM | POA: Diagnosis not present

## 2018-05-06 DIAGNOSIS — N3090 Cystitis, unspecified without hematuria: Secondary | ICD-10-CM | POA: Diagnosis not present

## 2018-05-07 DIAGNOSIS — N3090 Cystitis, unspecified without hematuria: Secondary | ICD-10-CM | POA: Diagnosis not present

## 2018-05-07 DIAGNOSIS — M869 Osteomyelitis, unspecified: Secondary | ICD-10-CM | POA: Diagnosis not present

## 2018-05-08 DIAGNOSIS — M869 Osteomyelitis, unspecified: Secondary | ICD-10-CM | POA: Diagnosis not present

## 2018-05-08 DIAGNOSIS — N3090 Cystitis, unspecified without hematuria: Secondary | ICD-10-CM | POA: Diagnosis not present

## 2018-05-09 DIAGNOSIS — N3090 Cystitis, unspecified without hematuria: Secondary | ICD-10-CM | POA: Diagnosis not present

## 2018-05-09 DIAGNOSIS — M869 Osteomyelitis, unspecified: Secondary | ICD-10-CM | POA: Diagnosis not present

## 2018-05-10 DIAGNOSIS — M869 Osteomyelitis, unspecified: Secondary | ICD-10-CM | POA: Diagnosis not present

## 2018-05-10 DIAGNOSIS — N3090 Cystitis, unspecified without hematuria: Secondary | ICD-10-CM | POA: Diagnosis not present

## 2018-05-11 DIAGNOSIS — Z792 Long term (current) use of antibiotics: Secondary | ICD-10-CM | POA: Diagnosis not present

## 2018-05-11 DIAGNOSIS — M869 Osteomyelitis, unspecified: Secondary | ICD-10-CM | POA: Diagnosis not present

## 2018-05-11 DIAGNOSIS — N3090 Cystitis, unspecified without hematuria: Secondary | ICD-10-CM | POA: Diagnosis not present

## 2018-05-12 DIAGNOSIS — N3090 Cystitis, unspecified without hematuria: Secondary | ICD-10-CM | POA: Diagnosis not present

## 2018-05-12 DIAGNOSIS — M869 Osteomyelitis, unspecified: Secondary | ICD-10-CM | POA: Diagnosis not present

## 2018-05-13 DIAGNOSIS — N3090 Cystitis, unspecified without hematuria: Secondary | ICD-10-CM | POA: Diagnosis not present

## 2018-05-13 DIAGNOSIS — M869 Osteomyelitis, unspecified: Secondary | ICD-10-CM | POA: Diagnosis not present

## 2018-05-14 DIAGNOSIS — M869 Osteomyelitis, unspecified: Secondary | ICD-10-CM | POA: Diagnosis not present

## 2018-05-14 DIAGNOSIS — N3090 Cystitis, unspecified without hematuria: Secondary | ICD-10-CM | POA: Diagnosis not present

## 2018-05-15 DIAGNOSIS — N3090 Cystitis, unspecified without hematuria: Secondary | ICD-10-CM | POA: Diagnosis not present

## 2018-05-15 DIAGNOSIS — M869 Osteomyelitis, unspecified: Secondary | ICD-10-CM | POA: Diagnosis not present

## 2018-05-16 DIAGNOSIS — M869 Osteomyelitis, unspecified: Secondary | ICD-10-CM | POA: Diagnosis not present

## 2018-05-16 DIAGNOSIS — N3090 Cystitis, unspecified without hematuria: Secondary | ICD-10-CM | POA: Diagnosis not present

## 2018-05-17 DIAGNOSIS — M869 Osteomyelitis, unspecified: Secondary | ICD-10-CM | POA: Diagnosis not present

## 2018-05-17 DIAGNOSIS — N3090 Cystitis, unspecified without hematuria: Secondary | ICD-10-CM | POA: Diagnosis not present

## 2018-05-18 DIAGNOSIS — N3090 Cystitis, unspecified without hematuria: Secondary | ICD-10-CM | POA: Diagnosis not present

## 2018-05-18 DIAGNOSIS — Z792 Long term (current) use of antibiotics: Secondary | ICD-10-CM | POA: Diagnosis not present

## 2018-05-18 DIAGNOSIS — M869 Osteomyelitis, unspecified: Secondary | ICD-10-CM | POA: Diagnosis not present

## 2018-05-19 DIAGNOSIS — M869 Osteomyelitis, unspecified: Secondary | ICD-10-CM | POA: Diagnosis not present

## 2018-05-19 DIAGNOSIS — N3090 Cystitis, unspecified without hematuria: Secondary | ICD-10-CM | POA: Diagnosis not present

## 2018-05-20 DIAGNOSIS — N3090 Cystitis, unspecified without hematuria: Secondary | ICD-10-CM | POA: Diagnosis not present

## 2018-05-20 DIAGNOSIS — M869 Osteomyelitis, unspecified: Secondary | ICD-10-CM | POA: Diagnosis not present

## 2018-05-21 DIAGNOSIS — M869 Osteomyelitis, unspecified: Secondary | ICD-10-CM | POA: Diagnosis not present

## 2018-05-21 DIAGNOSIS — N3090 Cystitis, unspecified without hematuria: Secondary | ICD-10-CM | POA: Diagnosis not present

## 2018-05-22 DIAGNOSIS — M869 Osteomyelitis, unspecified: Secondary | ICD-10-CM | POA: Diagnosis not present

## 2018-05-22 DIAGNOSIS — N3090 Cystitis, unspecified without hematuria: Secondary | ICD-10-CM | POA: Diagnosis not present

## 2018-05-23 DIAGNOSIS — M869 Osteomyelitis, unspecified: Secondary | ICD-10-CM | POA: Diagnosis not present

## 2018-05-23 DIAGNOSIS — N3090 Cystitis, unspecified without hematuria: Secondary | ICD-10-CM | POA: Diagnosis not present

## 2018-05-24 DIAGNOSIS — R634 Abnormal weight loss: Secondary | ICD-10-CM | POA: Diagnosis not present

## 2018-05-24 DIAGNOSIS — M8580 Other specified disorders of bone density and structure, unspecified site: Secondary | ICD-10-CM | POA: Diagnosis not present

## 2018-05-24 DIAGNOSIS — Z6828 Body mass index (BMI) 28.0-28.9, adult: Secondary | ICD-10-CM | POA: Diagnosis not present

## 2018-05-24 DIAGNOSIS — M869 Osteomyelitis, unspecified: Secondary | ICD-10-CM | POA: Diagnosis not present

## 2018-05-25 DIAGNOSIS — B9561 Methicillin susceptible Staphylococcus aureus infection as the cause of diseases classified elsewhere: Secondary | ICD-10-CM | POA: Diagnosis not present

## 2018-05-25 DIAGNOSIS — Z452 Encounter for adjustment and management of vascular access device: Secondary | ICD-10-CM | POA: Diagnosis not present

## 2018-05-25 DIAGNOSIS — N739 Female pelvic inflammatory disease, unspecified: Secondary | ICD-10-CM | POA: Diagnosis not present

## 2018-05-25 DIAGNOSIS — M869 Osteomyelitis, unspecified: Secondary | ICD-10-CM | POA: Diagnosis not present

## 2018-05-25 DIAGNOSIS — Z87891 Personal history of nicotine dependence: Secondary | ICD-10-CM | POA: Diagnosis not present

## 2018-05-25 DIAGNOSIS — S3210XD Unspecified fracture of sacrum, subsequent encounter for fracture with routine healing: Secondary | ICD-10-CM | POA: Diagnosis not present

## 2018-05-25 DIAGNOSIS — D473 Essential (hemorrhagic) thrombocythemia: Secondary | ICD-10-CM | POA: Diagnosis not present

## 2018-05-25 DIAGNOSIS — M0080 Arthritis due to other bacteria, unspecified joint: Secondary | ICD-10-CM | POA: Diagnosis not present

## 2018-05-25 DIAGNOSIS — M069 Rheumatoid arthritis, unspecified: Secondary | ICD-10-CM | POA: Diagnosis not present

## 2018-05-26 DIAGNOSIS — M1990 Unspecified osteoarthritis, unspecified site: Secondary | ICD-10-CM | POA: Diagnosis not present

## 2018-05-26 DIAGNOSIS — Z79899 Other long term (current) drug therapy: Secondary | ICD-10-CM | POA: Diagnosis not present

## 2018-05-26 DIAGNOSIS — M0589 Other rheumatoid arthritis with rheumatoid factor of multiple sites: Secondary | ICD-10-CM | POA: Diagnosis not present

## 2018-05-26 DIAGNOSIS — M79643 Pain in unspecified hand: Secondary | ICD-10-CM | POA: Diagnosis not present

## 2018-05-26 DIAGNOSIS — Z23 Encounter for immunization: Secondary | ICD-10-CM | POA: Diagnosis not present

## 2018-05-28 DIAGNOSIS — Z452 Encounter for adjustment and management of vascular access device: Secondary | ICD-10-CM | POA: Diagnosis not present

## 2018-05-28 DIAGNOSIS — B9620 Unspecified Escherichia coli [E. coli] as the cause of diseases classified elsewhere: Secondary | ICD-10-CM | POA: Diagnosis not present

## 2018-05-28 DIAGNOSIS — M069 Rheumatoid arthritis, unspecified: Secondary | ICD-10-CM | POA: Diagnosis not present

## 2018-05-28 DIAGNOSIS — M869 Osteomyelitis, unspecified: Secondary | ICD-10-CM | POA: Diagnosis not present

## 2018-05-28 DIAGNOSIS — R600 Localized edema: Secondary | ICD-10-CM | POA: Diagnosis not present

## 2018-05-28 DIAGNOSIS — R911 Solitary pulmonary nodule: Secondary | ICD-10-CM | POA: Diagnosis not present

## 2018-05-28 DIAGNOSIS — E871 Hypo-osmolality and hyponatremia: Secondary | ICD-10-CM | POA: Diagnosis not present

## 2018-05-28 DIAGNOSIS — B9689 Other specified bacterial agents as the cause of diseases classified elsewhere: Secondary | ICD-10-CM | POA: Diagnosis not present

## 2018-05-28 DIAGNOSIS — N3090 Cystitis, unspecified without hematuria: Secondary | ICD-10-CM | POA: Diagnosis not present

## 2018-05-28 DIAGNOSIS — M8618 Other acute osteomyelitis, other site: Secondary | ICD-10-CM | POA: Diagnosis not present

## 2018-05-28 DIAGNOSIS — Z22321 Carrier or suspected carrier of Methicillin susceptible Staphylococcus aureus: Secondary | ICD-10-CM | POA: Diagnosis not present

## 2018-05-28 DIAGNOSIS — D638 Anemia in other chronic diseases classified elsewhere: Secondary | ICD-10-CM | POA: Diagnosis not present

## 2018-05-28 DIAGNOSIS — L02215 Cutaneous abscess of perineum: Secondary | ICD-10-CM | POA: Diagnosis not present

## 2018-05-28 DIAGNOSIS — Z23 Encounter for immunization: Secondary | ICD-10-CM | POA: Diagnosis not present

## 2018-05-28 DIAGNOSIS — N739 Female pelvic inflammatory disease, unspecified: Secondary | ICD-10-CM | POA: Diagnosis not present

## 2018-05-28 DIAGNOSIS — N390 Urinary tract infection, site not specified: Secondary | ICD-10-CM | POA: Diagnosis not present

## 2018-05-28 DIAGNOSIS — K219 Gastro-esophageal reflux disease without esophagitis: Secondary | ICD-10-CM | POA: Diagnosis not present

## 2018-05-28 DIAGNOSIS — Z79899 Other long term (current) drug therapy: Secondary | ICD-10-CM | POA: Diagnosis not present

## 2018-05-29 DIAGNOSIS — M869 Osteomyelitis, unspecified: Secondary | ICD-10-CM | POA: Diagnosis not present

## 2018-05-29 DIAGNOSIS — R911 Solitary pulmonary nodule: Secondary | ICD-10-CM | POA: Diagnosis not present

## 2018-05-29 DIAGNOSIS — M069 Rheumatoid arthritis, unspecified: Secondary | ICD-10-CM | POA: Diagnosis not present

## 2018-05-29 DIAGNOSIS — N739 Female pelvic inflammatory disease, unspecified: Secondary | ICD-10-CM | POA: Diagnosis not present

## 2018-05-30 DIAGNOSIS — R911 Solitary pulmonary nodule: Secondary | ICD-10-CM | POA: Diagnosis not present

## 2018-05-30 DIAGNOSIS — N739 Female pelvic inflammatory disease, unspecified: Secondary | ICD-10-CM | POA: Diagnosis not present

## 2018-05-30 DIAGNOSIS — M869 Osteomyelitis, unspecified: Secondary | ICD-10-CM | POA: Diagnosis not present

## 2018-05-30 DIAGNOSIS — M069 Rheumatoid arthritis, unspecified: Secondary | ICD-10-CM | POA: Diagnosis not present

## 2018-05-31 DIAGNOSIS — M869 Osteomyelitis, unspecified: Secondary | ICD-10-CM | POA: Diagnosis not present

## 2018-05-31 DIAGNOSIS — L02215 Cutaneous abscess of perineum: Secondary | ICD-10-CM | POA: Diagnosis not present

## 2018-05-31 DIAGNOSIS — R911 Solitary pulmonary nodule: Secondary | ICD-10-CM | POA: Diagnosis not present

## 2018-05-31 DIAGNOSIS — N739 Female pelvic inflammatory disease, unspecified: Secondary | ICD-10-CM | POA: Diagnosis not present

## 2018-05-31 DIAGNOSIS — M069 Rheumatoid arthritis, unspecified: Secondary | ICD-10-CM | POA: Diagnosis not present

## 2018-06-01 DIAGNOSIS — R911 Solitary pulmonary nodule: Secondary | ICD-10-CM | POA: Diagnosis not present

## 2018-06-01 DIAGNOSIS — N739 Female pelvic inflammatory disease, unspecified: Secondary | ICD-10-CM | POA: Diagnosis not present

## 2018-06-01 DIAGNOSIS — M869 Osteomyelitis, unspecified: Secondary | ICD-10-CM | POA: Diagnosis not present

## 2018-06-01 DIAGNOSIS — M069 Rheumatoid arthritis, unspecified: Secondary | ICD-10-CM | POA: Diagnosis not present

## 2018-06-02 DIAGNOSIS — N390 Urinary tract infection, site not specified: Secondary | ICD-10-CM | POA: Diagnosis not present

## 2018-06-02 DIAGNOSIS — D638 Anemia in other chronic diseases classified elsewhere: Secondary | ICD-10-CM | POA: Diagnosis not present

## 2018-06-02 DIAGNOSIS — E871 Hypo-osmolality and hyponatremia: Secondary | ICD-10-CM | POA: Diagnosis not present

## 2018-06-02 DIAGNOSIS — Z22321 Carrier or suspected carrier of Methicillin susceptible Staphylococcus aureus: Secondary | ICD-10-CM | POA: Diagnosis not present

## 2018-06-02 DIAGNOSIS — N739 Female pelvic inflammatory disease, unspecified: Secondary | ICD-10-CM | POA: Diagnosis not present

## 2018-06-02 DIAGNOSIS — M8618 Other acute osteomyelitis, other site: Secondary | ICD-10-CM | POA: Diagnosis not present

## 2018-06-02 DIAGNOSIS — B9620 Unspecified Escherichia coli [E. coli] as the cause of diseases classified elsewhere: Secondary | ICD-10-CM | POA: Diagnosis not present

## 2018-06-02 DIAGNOSIS — K219 Gastro-esophageal reflux disease without esophagitis: Secondary | ICD-10-CM | POA: Diagnosis not present

## 2018-06-02 DIAGNOSIS — M069 Rheumatoid arthritis, unspecified: Secondary | ICD-10-CM | POA: Diagnosis not present

## 2018-06-02 DIAGNOSIS — Z23 Encounter for immunization: Secondary | ICD-10-CM | POA: Diagnosis not present

## 2018-06-02 DIAGNOSIS — N3090 Cystitis, unspecified without hematuria: Secondary | ICD-10-CM | POA: Diagnosis not present

## 2018-06-02 DIAGNOSIS — M869 Osteomyelitis, unspecified: Secondary | ICD-10-CM | POA: Diagnosis not present

## 2018-06-03 DIAGNOSIS — B952 Enterococcus as the cause of diseases classified elsewhere: Secondary | ICD-10-CM | POA: Diagnosis not present

## 2018-06-03 DIAGNOSIS — M069 Rheumatoid arthritis, unspecified: Secondary | ICD-10-CM | POA: Diagnosis not present

## 2018-06-03 DIAGNOSIS — N739 Female pelvic inflammatory disease, unspecified: Secondary | ICD-10-CM | POA: Diagnosis not present

## 2018-06-03 DIAGNOSIS — M869 Osteomyelitis, unspecified: Secondary | ICD-10-CM | POA: Diagnosis not present

## 2018-06-03 DIAGNOSIS — M8618 Other acute osteomyelitis, other site: Secondary | ICD-10-CM | POA: Diagnosis not present

## 2018-06-03 DIAGNOSIS — Z87891 Personal history of nicotine dependence: Secondary | ICD-10-CM | POA: Diagnosis not present

## 2018-06-03 DIAGNOSIS — R911 Solitary pulmonary nodule: Secondary | ICD-10-CM | POA: Diagnosis not present

## 2018-06-03 DIAGNOSIS — D638 Anemia in other chronic diseases classified elsewhere: Secondary | ICD-10-CM | POA: Diagnosis not present

## 2018-06-03 DIAGNOSIS — N390 Urinary tract infection, site not specified: Secondary | ICD-10-CM | POA: Diagnosis not present

## 2018-06-03 DIAGNOSIS — B9561 Methicillin susceptible Staphylococcus aureus infection as the cause of diseases classified elsewhere: Secondary | ICD-10-CM | POA: Diagnosis not present

## 2018-06-03 DIAGNOSIS — Z452 Encounter for adjustment and management of vascular access device: Secondary | ICD-10-CM | POA: Diagnosis not present

## 2018-06-03 DIAGNOSIS — N3090 Cystitis, unspecified without hematuria: Secondary | ICD-10-CM | POA: Diagnosis not present

## 2018-06-04 DIAGNOSIS — N3090 Cystitis, unspecified without hematuria: Secondary | ICD-10-CM | POA: Diagnosis not present

## 2018-06-04 DIAGNOSIS — M869 Osteomyelitis, unspecified: Secondary | ICD-10-CM | POA: Diagnosis not present

## 2018-06-05 DIAGNOSIS — N3090 Cystitis, unspecified without hematuria: Secondary | ICD-10-CM | POA: Diagnosis not present

## 2018-06-05 DIAGNOSIS — M869 Osteomyelitis, unspecified: Secondary | ICD-10-CM | POA: Diagnosis not present

## 2018-06-06 DIAGNOSIS — N3090 Cystitis, unspecified without hematuria: Secondary | ICD-10-CM | POA: Diagnosis not present

## 2018-06-06 DIAGNOSIS — M869 Osteomyelitis, unspecified: Secondary | ICD-10-CM | POA: Diagnosis not present

## 2018-06-07 DIAGNOSIS — N3090 Cystitis, unspecified without hematuria: Secondary | ICD-10-CM | POA: Diagnosis not present

## 2018-06-07 DIAGNOSIS — M869 Osteomyelitis, unspecified: Secondary | ICD-10-CM | POA: Diagnosis not present

## 2018-06-08 DIAGNOSIS — B952 Enterococcus as the cause of diseases classified elsewhere: Secondary | ICD-10-CM | POA: Diagnosis not present

## 2018-06-08 DIAGNOSIS — D638 Anemia in other chronic diseases classified elsewhere: Secondary | ICD-10-CM | POA: Diagnosis not present

## 2018-06-08 DIAGNOSIS — Z87891 Personal history of nicotine dependence: Secondary | ICD-10-CM | POA: Diagnosis not present

## 2018-06-08 DIAGNOSIS — N3090 Cystitis, unspecified without hematuria: Secondary | ICD-10-CM | POA: Diagnosis not present

## 2018-06-08 DIAGNOSIS — N390 Urinary tract infection, site not specified: Secondary | ICD-10-CM | POA: Diagnosis not present

## 2018-06-08 DIAGNOSIS — B9561 Methicillin susceptible Staphylococcus aureus infection as the cause of diseases classified elsewhere: Secondary | ICD-10-CM | POA: Diagnosis not present

## 2018-06-08 DIAGNOSIS — N739 Female pelvic inflammatory disease, unspecified: Secondary | ICD-10-CM | POA: Diagnosis not present

## 2018-06-08 DIAGNOSIS — Z452 Encounter for adjustment and management of vascular access device: Secondary | ICD-10-CM | POA: Diagnosis not present

## 2018-06-08 DIAGNOSIS — R911 Solitary pulmonary nodule: Secondary | ICD-10-CM | POA: Diagnosis not present

## 2018-06-08 DIAGNOSIS — M869 Osteomyelitis, unspecified: Secondary | ICD-10-CM | POA: Diagnosis not present

## 2018-06-08 DIAGNOSIS — M8618 Other acute osteomyelitis, other site: Secondary | ICD-10-CM | POA: Diagnosis not present

## 2018-06-08 DIAGNOSIS — M069 Rheumatoid arthritis, unspecified: Secondary | ICD-10-CM | POA: Diagnosis not present

## 2018-06-09 DIAGNOSIS — M869 Osteomyelitis, unspecified: Secondary | ICD-10-CM | POA: Diagnosis not present

## 2018-06-09 DIAGNOSIS — N3090 Cystitis, unspecified without hematuria: Secondary | ICD-10-CM | POA: Diagnosis not present

## 2018-06-10 DIAGNOSIS — N3090 Cystitis, unspecified without hematuria: Secondary | ICD-10-CM | POA: Diagnosis not present

## 2018-06-10 DIAGNOSIS — M869 Osteomyelitis, unspecified: Secondary | ICD-10-CM | POA: Diagnosis not present

## 2018-06-11 DIAGNOSIS — N3090 Cystitis, unspecified without hematuria: Secondary | ICD-10-CM | POA: Diagnosis not present

## 2018-06-11 DIAGNOSIS — M869 Osteomyelitis, unspecified: Secondary | ICD-10-CM | POA: Diagnosis not present

## 2018-06-12 DIAGNOSIS — N3090 Cystitis, unspecified without hematuria: Secondary | ICD-10-CM | POA: Diagnosis not present

## 2018-06-12 DIAGNOSIS — M869 Osteomyelitis, unspecified: Secondary | ICD-10-CM | POA: Diagnosis not present

## 2018-06-13 DIAGNOSIS — N3090 Cystitis, unspecified without hematuria: Secondary | ICD-10-CM | POA: Diagnosis not present

## 2018-06-13 DIAGNOSIS — M869 Osteomyelitis, unspecified: Secondary | ICD-10-CM | POA: Diagnosis not present

## 2018-06-14 DIAGNOSIS — Z6828 Body mass index (BMI) 28.0-28.9, adult: Secondary | ICD-10-CM | POA: Diagnosis not present

## 2018-06-14 DIAGNOSIS — M869 Osteomyelitis, unspecified: Secondary | ICD-10-CM | POA: Diagnosis not present

## 2018-06-14 DIAGNOSIS — Z2821 Immunization not carried out because of patient refusal: Secondary | ICD-10-CM | POA: Diagnosis not present

## 2018-06-14 DIAGNOSIS — N3090 Cystitis, unspecified without hematuria: Secondary | ICD-10-CM | POA: Diagnosis not present

## 2018-06-14 DIAGNOSIS — B9629 Other Escherichia coli [E. coli] as the cause of diseases classified elsewhere: Secondary | ICD-10-CM | POA: Diagnosis not present

## 2018-06-14 DIAGNOSIS — N390 Urinary tract infection, site not specified: Secondary | ICD-10-CM | POA: Diagnosis not present

## 2018-06-14 DIAGNOSIS — B379 Candidiasis, unspecified: Secondary | ICD-10-CM | POA: Diagnosis not present

## 2018-06-14 DIAGNOSIS — Z1612 Extended spectrum beta lactamase (ESBL) resistance: Secondary | ICD-10-CM | POA: Diagnosis not present

## 2018-06-15 DIAGNOSIS — M869 Osteomyelitis, unspecified: Secondary | ICD-10-CM | POA: Diagnosis not present

## 2018-06-15 DIAGNOSIS — D638 Anemia in other chronic diseases classified elsewhere: Secondary | ICD-10-CM | POA: Diagnosis not present

## 2018-06-15 DIAGNOSIS — N3090 Cystitis, unspecified without hematuria: Secondary | ICD-10-CM | POA: Diagnosis not present

## 2018-06-15 DIAGNOSIS — N390 Urinary tract infection, site not specified: Secondary | ICD-10-CM | POA: Diagnosis not present

## 2018-06-15 DIAGNOSIS — Z87891 Personal history of nicotine dependence: Secondary | ICD-10-CM | POA: Diagnosis not present

## 2018-06-15 DIAGNOSIS — B952 Enterococcus as the cause of diseases classified elsewhere: Secondary | ICD-10-CM | POA: Diagnosis not present

## 2018-06-15 DIAGNOSIS — M069 Rheumatoid arthritis, unspecified: Secondary | ICD-10-CM | POA: Diagnosis not present

## 2018-06-15 DIAGNOSIS — M8618 Other acute osteomyelitis, other site: Secondary | ICD-10-CM | POA: Diagnosis not present

## 2018-06-15 DIAGNOSIS — B9561 Methicillin susceptible Staphylococcus aureus infection as the cause of diseases classified elsewhere: Secondary | ICD-10-CM | POA: Diagnosis not present

## 2018-06-15 DIAGNOSIS — N739 Female pelvic inflammatory disease, unspecified: Secondary | ICD-10-CM | POA: Diagnosis not present

## 2018-06-15 DIAGNOSIS — Z452 Encounter for adjustment and management of vascular access device: Secondary | ICD-10-CM | POA: Diagnosis not present

## 2018-06-15 DIAGNOSIS — R911 Solitary pulmonary nodule: Secondary | ICD-10-CM | POA: Diagnosis not present

## 2018-06-16 DIAGNOSIS — M869 Osteomyelitis, unspecified: Secondary | ICD-10-CM | POA: Diagnosis not present

## 2018-06-16 DIAGNOSIS — N3090 Cystitis, unspecified without hematuria: Secondary | ICD-10-CM | POA: Diagnosis not present

## 2018-06-17 DIAGNOSIS — N3090 Cystitis, unspecified without hematuria: Secondary | ICD-10-CM | POA: Diagnosis not present

## 2018-06-17 DIAGNOSIS — M869 Osteomyelitis, unspecified: Secondary | ICD-10-CM | POA: Diagnosis not present

## 2018-06-18 DIAGNOSIS — M869 Osteomyelitis, unspecified: Secondary | ICD-10-CM | POA: Diagnosis not present

## 2018-06-18 DIAGNOSIS — N3090 Cystitis, unspecified without hematuria: Secondary | ICD-10-CM | POA: Diagnosis not present

## 2018-06-19 DIAGNOSIS — M869 Osteomyelitis, unspecified: Secondary | ICD-10-CM | POA: Diagnosis not present

## 2018-06-19 DIAGNOSIS — N3090 Cystitis, unspecified without hematuria: Secondary | ICD-10-CM | POA: Diagnosis not present

## 2018-06-20 DIAGNOSIS — M869 Osteomyelitis, unspecified: Secondary | ICD-10-CM | POA: Diagnosis not present

## 2018-06-20 DIAGNOSIS — N3090 Cystitis, unspecified without hematuria: Secondary | ICD-10-CM | POA: Diagnosis not present

## 2018-06-21 DIAGNOSIS — M869 Osteomyelitis, unspecified: Secondary | ICD-10-CM | POA: Diagnosis not present

## 2018-06-21 DIAGNOSIS — N3090 Cystitis, unspecified without hematuria: Secondary | ICD-10-CM | POA: Diagnosis not present

## 2018-06-22 DIAGNOSIS — Z87891 Personal history of nicotine dependence: Secondary | ICD-10-CM | POA: Diagnosis not present

## 2018-06-22 DIAGNOSIS — B9561 Methicillin susceptible Staphylococcus aureus infection as the cause of diseases classified elsewhere: Secondary | ICD-10-CM | POA: Diagnosis not present

## 2018-06-22 DIAGNOSIS — M8618 Other acute osteomyelitis, other site: Secondary | ICD-10-CM | POA: Diagnosis not present

## 2018-06-22 DIAGNOSIS — R911 Solitary pulmonary nodule: Secondary | ICD-10-CM | POA: Diagnosis not present

## 2018-06-22 DIAGNOSIS — Z452 Encounter for adjustment and management of vascular access device: Secondary | ICD-10-CM | POA: Diagnosis not present

## 2018-06-22 DIAGNOSIS — N739 Female pelvic inflammatory disease, unspecified: Secondary | ICD-10-CM | POA: Diagnosis not present

## 2018-06-22 DIAGNOSIS — B952 Enterococcus as the cause of diseases classified elsewhere: Secondary | ICD-10-CM | POA: Diagnosis not present

## 2018-06-22 DIAGNOSIS — N3090 Cystitis, unspecified without hematuria: Secondary | ICD-10-CM | POA: Diagnosis not present

## 2018-06-22 DIAGNOSIS — M869 Osteomyelitis, unspecified: Secondary | ICD-10-CM | POA: Diagnosis not present

## 2018-06-22 DIAGNOSIS — D638 Anemia in other chronic diseases classified elsewhere: Secondary | ICD-10-CM | POA: Diagnosis not present

## 2018-06-22 DIAGNOSIS — M069 Rheumatoid arthritis, unspecified: Secondary | ICD-10-CM | POA: Diagnosis not present

## 2018-06-22 DIAGNOSIS — N390 Urinary tract infection, site not specified: Secondary | ICD-10-CM | POA: Diagnosis not present

## 2018-06-23 DIAGNOSIS — N3090 Cystitis, unspecified without hematuria: Secondary | ICD-10-CM | POA: Diagnosis not present

## 2018-06-23 DIAGNOSIS — M869 Osteomyelitis, unspecified: Secondary | ICD-10-CM | POA: Diagnosis not present

## 2018-06-24 DIAGNOSIS — M869 Osteomyelitis, unspecified: Secondary | ICD-10-CM | POA: Diagnosis not present

## 2018-06-24 DIAGNOSIS — N3090 Cystitis, unspecified without hematuria: Secondary | ICD-10-CM | POA: Diagnosis not present

## 2018-06-25 DIAGNOSIS — N3090 Cystitis, unspecified without hematuria: Secondary | ICD-10-CM | POA: Diagnosis not present

## 2018-06-25 DIAGNOSIS — M869 Osteomyelitis, unspecified: Secondary | ICD-10-CM | POA: Diagnosis not present

## 2018-06-26 DIAGNOSIS — N3090 Cystitis, unspecified without hematuria: Secondary | ICD-10-CM | POA: Diagnosis not present

## 2018-06-26 DIAGNOSIS — M869 Osteomyelitis, unspecified: Secondary | ICD-10-CM | POA: Diagnosis not present

## 2018-06-27 DIAGNOSIS — N3090 Cystitis, unspecified without hematuria: Secondary | ICD-10-CM | POA: Diagnosis not present

## 2018-06-27 DIAGNOSIS — M869 Osteomyelitis, unspecified: Secondary | ICD-10-CM | POA: Diagnosis not present

## 2018-06-28 ENCOUNTER — Ambulatory Visit: Payer: Medicare HMO | Admitting: Family

## 2018-06-28 ENCOUNTER — Encounter: Payer: Self-pay | Admitting: Family

## 2018-06-28 VITALS — BP 140/88 | HR 80 | Temp 98.5°F | Wt 176.0 lb

## 2018-06-28 DIAGNOSIS — N3080 Other cystitis without hematuria: Secondary | ICD-10-CM | POA: Diagnosis not present

## 2018-06-28 DIAGNOSIS — N3090 Cystitis, unspecified without hematuria: Secondary | ICD-10-CM | POA: Diagnosis not present

## 2018-06-28 DIAGNOSIS — M869 Osteomyelitis, unspecified: Secondary | ICD-10-CM

## 2018-06-28 NOTE — Assessment & Plan Note (Signed)
Natalie Osborn was recently admitted to [facility] in August and treated for MSSA osteomyelitis of the pubic symphysis only to have returning pain following 6 weeks of IV therapy with Cefazolin. Secondary hospitalization with concern for enterococcus, MSSA and ESBL. Currently about 4 weeks into IV therapy with meropenem and is asymptomatic. Will check her blood work and add inflammatory markers for future guidance. Plan for CT scan of the abdomen and pelvis to check for resolution of bladder abscess. Continue current dose of Meropenem with end date of 11/20. Follow up in 2 weeks or sooner if needed.

## 2018-06-28 NOTE — Patient Instructions (Signed)
Nice to meet you.  Please continue to take the meropenem as prescribed.  We will get a CT scan of your Abdomen and pelvis to check for resolution of your abscesses and improving bone infection.  Follow up office visit in either 2 weeks with Janene Madeira, NP or in 3 weeks with Terri Piedra, NP.  Please let us know if you have any questions.

## 2018-06-28 NOTE — Assessment & Plan Note (Signed)
See A/P for osteomyelitis

## 2018-06-28 NOTE — Progress Notes (Signed)
Subjective:    Patient ID: Natalie Osborn, female    DOB: [birth year], 57 y.o.   MRN: [id number]  Chief Complaint  Patient presents with  . Osteomyelitis     HPI:  Natalie Osborn is a 57 y.o. female who presents today for initial office visit following hospitalization for osteomyelitis of the pelvis with bladder abscess.   Ms. Willmott was initially seen at [facility] in August of 2019 with lower abdominal pain and found to have pubic symphysis osteomyelitis with abscesses. Cultures were obtained and positive for MSSA. She was initially treated with 6 weeks of Cefazolin. After completion she was readmitted with recurrent abdominal pain. She was started on vancomycin and cefepime. CT guided drainage performed on 05/31/18. Her urine culture was positive for ESBL Enterococcus with wound drainage showing gram positive cocci. Case was consulted with Dr. Johnnye Sima who recommended 6 weeks of meropenem Enterococcus, MSSA and ESBL. A new PICC line was inserted with end date scheduled for 11/20.  Ms. 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 She has no adverse side effects. PICC line is maintained by [facility] and dressing changes are occurring weekly. Denies signs/symptoms of infection. She has no current symptoms.    No Known Allergies    Outpatient Medications Prior to Visit  Medication Sig Dispense Refill  . calcium carbonate 1250 MG capsule Take 1,250 mg by mouth 2 (two) times daily with a meal.    . ferrous fumarate (HEMOCYTE - 106 MG FE) 325 (106 FE) MG TABS tablet Take 1 tablet by mouth.    . folic acid (FOLVITE) 1 MG tablet Take 2 mg by mouth 2 (two) times daily.    Marland Kitchen MEROPENEM IV Inject into the vein.    . methotrexate (50 MG/ML) 1 g injection Inject 0.8 mg into the vein once a week.    Marland Kitchen omeprazole (PRILOSEC) 40 MG capsule Take 40 mg by mouth daily.     No facility-administered medications  prior to visit.      Past Medical History:  Diagnosis Date  . Rheumatoid arthritis [facility])       Past Surgical History:  Procedure Laterality Date  . BREAST LUMPECTOMY  1988      Family History  Problem Relation Age of Onset  . Diabetes Mother   . Cancer Father   . Other Brother        Crypto Meningitis      Social History   Socioeconomic History  . Marital status: Married    Spouse name: Not on file  . Number of children: Not on file  . Years of education: Not on file  . Highest education level: Not on file  Occupational History  . Not on file  Social Needs  . Financial resource strain: Not on file  . Food insecurity:    Worry: Not on file    Inability: Not on file  . Transportation needs:    Medical: Not on file    Non-medical: Not on file  Tobacco Use  . Smoking status: Former Smoker    Packs/day: 1.00    Years: 7.00    Pack years: 7.00    Types: Cigarettes    Last attempt to quit: 05/06/1994    Years since quitting: 24.1  . Smokeless tobacco: Never Used  Substance and Sexual Activity  . Alcohol use: Not on file  . Drug use: Not on file  . Sexual activity: Not on  file  Lifestyle  . Physical activity:    Days per week: Not on file    Minutes per session: Not on file  . Stress: Not on file  Relationships  . Social connections:    Talks on phone: Not on file    Gets together: Not on file    Attends religious service: Not on file    Active member of club or organization: Not on file    Attends meetings of clubs or organizations: Not on file    Relationship status: Not on file  . Intimate partner violence:    Fear of current or ex partner: Not on file    Emotionally abused: Not on file    Physically abused: Not on file    Forced sexual activity: Not on file  Other Topics Concern  . Not on file  Social History Narrative  . Not on file      Review of Systems  Constitutional: Negative for chills and fever.  Respiratory: Negative for chest  tightness and shortness of breath.   Cardiovascular: Negative for chest pain and palpitations.  Gastrointestinal: Negative for abdominal pain, constipation, diarrhea and nausea.  Genitourinary: Negative for dysuria, flank pain, frequency and hematuria.  Skin: Negative for rash.       Objective:    BP 140/88   Pulse 80   Temp 98.5 F (36.9 C) (Oral)   Wt 176 lb (79.8 kg)   BMI 29.29 kg/m  Nursing note and vital signs reviewed.  Physical Exam  Constitutional: She is oriented to person, place, and time. She appears well-developed and well-nourished. No distress.  Cardiovascular: Normal rate, regular rhythm, normal heart sounds and intact distal pulses.  PICC line in right upper arm with dressing that is clean and dry. No evidence of infection.   Pulmonary/Chest: Effort normal and breath sounds normal.  Neurological: She is alert and oriented to person, place, and time.  Skin: Skin is warm and dry.  Psychiatric: She has a normal mood and affect.        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 Jamerson was recently admitted to [facility] in August and treated for MSSA osteomyelitis of the pubic symphysis only to have returning pain following 6 weeks of IV therapy with Cefazolin. Secondary hospitalization with concern for enterococcus, MSSA and ESBL. Currently about 4 weeks into IV therapy with meropenem and is asymptomatic. Will check her blood work and add inflammatory markers for future guidance. Plan for CT scan of the abdomen and pelvis to check for resolution of bladder abscess. Continue current dose of Meropenem with end date of 11/20. Follow up in 2 weeks or sooner if needed.       Relevant Medications   MEROPENEM IV   Other Relevant Orders   CT ABDOMEN W WO CONTRAST     Genitourinary   Abscess, bladder    See A/P for osteomyelitis      Relevant Orders    CT ABDOMEN W WO CONTRAST       I am having Alan Mulder maintain her calcium carbonate, ferrous fumarate, omeprazole, methotrexate, folic acid, and MEROPENEM IV.    Follow-up: Return in about 2 weeks (around 07/12/2018), or if symptoms worsen or fail to improve.    Terri Piedra, MSN, FNP-C Nurse Practitioner [facility] [place] Office  phone: [phone] Pager: [phone] [facility] Main number: [phone]

## 2018-06-29 DIAGNOSIS — M8618 Other acute osteomyelitis, other site: Secondary | ICD-10-CM | POA: Diagnosis not present

## 2018-06-29 DIAGNOSIS — M869 Osteomyelitis, unspecified: Secondary | ICD-10-CM | POA: Diagnosis not present

## 2018-06-29 DIAGNOSIS — N3090 Cystitis, unspecified without hematuria: Secondary | ICD-10-CM | POA: Diagnosis not present

## 2018-06-30 DIAGNOSIS — M869 Osteomyelitis, unspecified: Secondary | ICD-10-CM | POA: Diagnosis not present

## 2018-06-30 DIAGNOSIS — N3090 Cystitis, unspecified without hematuria: Secondary | ICD-10-CM | POA: Diagnosis not present

## 2018-07-01 DIAGNOSIS — M869 Osteomyelitis, unspecified: Secondary | ICD-10-CM | POA: Diagnosis not present

## 2018-07-01 DIAGNOSIS — N3090 Cystitis, unspecified without hematuria: Secondary | ICD-10-CM | POA: Diagnosis not present

## 2018-07-02 DIAGNOSIS — N3090 Cystitis, unspecified without hematuria: Secondary | ICD-10-CM | POA: Diagnosis not present

## 2018-07-02 DIAGNOSIS — M869 Osteomyelitis, unspecified: Secondary | ICD-10-CM | POA: Diagnosis not present

## 2018-07-03 DIAGNOSIS — M869 Osteomyelitis, unspecified: Secondary | ICD-10-CM | POA: Diagnosis not present

## 2018-07-03 DIAGNOSIS — N3090 Cystitis, unspecified without hematuria: Secondary | ICD-10-CM | POA: Diagnosis not present

## 2018-07-04 DIAGNOSIS — M869 Osteomyelitis, unspecified: Secondary | ICD-10-CM | POA: Diagnosis not present

## 2018-07-04 DIAGNOSIS — N3090 Cystitis, unspecified without hematuria: Secondary | ICD-10-CM | POA: Diagnosis not present

## 2018-07-05 DIAGNOSIS — N3090 Cystitis, unspecified without hematuria: Secondary | ICD-10-CM | POA: Diagnosis not present

## 2018-07-05 DIAGNOSIS — M869 Osteomyelitis, unspecified: Secondary | ICD-10-CM | POA: Diagnosis not present

## 2018-07-06 DIAGNOSIS — M869 Osteomyelitis, unspecified: Secondary | ICD-10-CM | POA: Diagnosis not present

## 2018-07-06 DIAGNOSIS — N3090 Cystitis, unspecified without hematuria: Secondary | ICD-10-CM | POA: Diagnosis not present

## 2018-07-06 DIAGNOSIS — M8618 Other acute osteomyelitis, other site: Secondary | ICD-10-CM | POA: Diagnosis not present

## 2018-07-07 DIAGNOSIS — M869 Osteomyelitis, unspecified: Secondary | ICD-10-CM | POA: Diagnosis not present

## 2018-07-07 DIAGNOSIS — N3090 Cystitis, unspecified without hematuria: Secondary | ICD-10-CM | POA: Diagnosis not present

## 2018-07-08 ENCOUNTER — Other Ambulatory Visit: Payer: Self-pay | Admitting: Family

## 2018-07-08 DIAGNOSIS — N3090 Cystitis, unspecified without hematuria: Secondary | ICD-10-CM | POA: Diagnosis not present

## 2018-07-08 DIAGNOSIS — N3080 Other cystitis without hematuria: Secondary | ICD-10-CM

## 2018-07-08 DIAGNOSIS — M869 Osteomyelitis, unspecified: Secondary | ICD-10-CM

## 2018-07-09 DIAGNOSIS — N3090 Cystitis, unspecified without hematuria: Secondary | ICD-10-CM | POA: Diagnosis not present

## 2018-07-09 DIAGNOSIS — M869 Osteomyelitis, unspecified: Secondary | ICD-10-CM | POA: Diagnosis not present

## 2018-07-10 DIAGNOSIS — N3090 Cystitis, unspecified without hematuria: Secondary | ICD-10-CM | POA: Diagnosis not present

## 2018-07-10 DIAGNOSIS — M869 Osteomyelitis, unspecified: Secondary | ICD-10-CM | POA: Diagnosis not present

## 2018-07-11 DIAGNOSIS — M869 Osteomyelitis, unspecified: Secondary | ICD-10-CM | POA: Diagnosis not present

## 2018-07-11 DIAGNOSIS — N3090 Cystitis, unspecified without hematuria: Secondary | ICD-10-CM | POA: Diagnosis not present

## 2018-07-12 ENCOUNTER — Telehealth: Payer: Self-pay | Admitting: *Deleted

## 2018-07-12 DIAGNOSIS — M869 Osteomyelitis, unspecified: Secondary | ICD-10-CM | POA: Diagnosis not present

## 2018-07-12 DIAGNOSIS — N3090 Cystitis, unspecified without hematuria: Secondary | ICD-10-CM | POA: Diagnosis not present

## 2018-07-12 NOTE — Telephone Encounter (Signed)
Working on trying to get approval as it has been denied to this point.

## 2018-07-12 NOTE — Telephone Encounter (Signed)
SHould she reschedule her follow up (11/26) or change stop date for iv antibiotics (11/23)?

## 2018-07-12 NOTE — Telephone Encounter (Signed)
Patient called concerned. She is due to complete IV antibiotics, but has not yet had her CT scan.  Please advise, as there are 2 different orders (one at [facility], her preferred, one at [place]). Unsure the status of prior authorization or image scheduling, but she has follow up at [facility] on 11/26. Landis Gandy, RN

## 2018-07-13 DIAGNOSIS — M8608 Acute hematogenous osteomyelitis, other sites: Secondary | ICD-10-CM | POA: Diagnosis not present

## 2018-07-13 DIAGNOSIS — D638 Anemia in other chronic diseases classified elsewhere: Secondary | ICD-10-CM | POA: Diagnosis not present

## 2018-07-13 DIAGNOSIS — M869 Osteomyelitis, unspecified: Secondary | ICD-10-CM | POA: Diagnosis not present

## 2018-07-13 DIAGNOSIS — N3090 Cystitis, unspecified without hematuria: Secondary | ICD-10-CM | POA: Diagnosis not present

## 2018-07-14 DIAGNOSIS — N3090 Cystitis, unspecified without hematuria: Secondary | ICD-10-CM | POA: Diagnosis not present

## 2018-07-14 DIAGNOSIS — M869 Osteomyelitis, unspecified: Secondary | ICD-10-CM | POA: Diagnosis not present

## 2018-07-14 NOTE — Telephone Encounter (Signed)
CT scan now approved. Will continue antibiotics for 1 additional week pending results of CT scan.

## 2018-07-15 DIAGNOSIS — N3090 Cystitis, unspecified without hematuria: Secondary | ICD-10-CM | POA: Diagnosis not present

## 2018-07-15 DIAGNOSIS — M869 Osteomyelitis, unspecified: Secondary | ICD-10-CM | POA: Diagnosis not present

## 2018-07-15 NOTE — Telephone Encounter (Signed)
Order to extend antibiotics 1 week per Terri Piedra, NP given to [facility] at [place] [phone]). She will notifiy the patient today. Landis Gandy, RN

## 2018-07-16 DIAGNOSIS — N3090 Cystitis, unspecified without hematuria: Secondary | ICD-10-CM | POA: Diagnosis not present

## 2018-07-16 DIAGNOSIS — M869 Osteomyelitis, unspecified: Secondary | ICD-10-CM | POA: Diagnosis not present

## 2018-07-18 DIAGNOSIS — N3090 Cystitis, unspecified without hematuria: Secondary | ICD-10-CM | POA: Diagnosis not present

## 2018-07-18 DIAGNOSIS — M869 Osteomyelitis, unspecified: Secondary | ICD-10-CM | POA: Diagnosis not present

## 2018-07-19 DIAGNOSIS — M85352 Osteitis condensans, left thigh: Secondary | ICD-10-CM | POA: Diagnosis not present

## 2018-07-19 DIAGNOSIS — M869 Osteomyelitis, unspecified: Secondary | ICD-10-CM | POA: Diagnosis not present

## 2018-07-19 DIAGNOSIS — N3090 Cystitis, unspecified without hematuria: Secondary | ICD-10-CM | POA: Diagnosis not present

## 2018-07-19 DIAGNOSIS — R937 Abnormal findings on diagnostic imaging of other parts of musculoskeletal system: Secondary | ICD-10-CM | POA: Diagnosis not present

## 2018-07-19 DIAGNOSIS — R918 Other nonspecific abnormal finding of lung field: Secondary | ICD-10-CM | POA: Diagnosis not present

## 2018-07-19 DIAGNOSIS — M85351 Osteitis condensans, right thigh: Secondary | ICD-10-CM | POA: Diagnosis not present

## 2018-07-19 DIAGNOSIS — N3080 Other cystitis without hematuria: Secondary | ICD-10-CM | POA: Diagnosis not present

## 2018-07-19 DIAGNOSIS — R102 Pelvic and perineal pain: Secondary | ICD-10-CM | POA: Diagnosis not present

## 2018-07-20 ENCOUNTER — Ambulatory Visit: Payer: Medicare HMO | Admitting: Family

## 2018-07-20 ENCOUNTER — Telehealth: Payer: Self-pay

## 2018-07-20 ENCOUNTER — Encounter: Payer: Self-pay | Admitting: Family

## 2018-07-20 VITALS — BP 146/81 | HR 90 | Temp 98.6°F | Wt 176.8 lb

## 2018-07-20 DIAGNOSIS — M8618 Other acute osteomyelitis, other site: Secondary | ICD-10-CM | POA: Diagnosis not present

## 2018-07-20 DIAGNOSIS — N3080 Other cystitis without hematuria: Secondary | ICD-10-CM | POA: Diagnosis not present

## 2018-07-20 DIAGNOSIS — M869 Osteomyelitis, unspecified: Secondary | ICD-10-CM

## 2018-07-20 DIAGNOSIS — N3090 Cystitis, unspecified without hematuria: Secondary | ICD-10-CM | POA: Diagnosis not present

## 2018-07-20 NOTE — Progress Notes (Signed)
Subjective:    Patient ID: Natalie Osborn, female    DOB: 08-12-1961, 57 y.o.   MRN: [id number]  Chief Complaint  Patient presents with  . Osteomyelitis   . Bladder Abscess     HPI:  Natalie Osborn is a 57 y.o. female who presents today for follow up of pelvic osteomyelitis and bladder abscess.  Natalie Osborn was last seen in the office on 06/28/18 for osteomyelitis of the pelvis and bladder abscess following CT guided drainage on 05/31/18 at [facility]. She was placed on Meropenem for 6 weeks with cultures positive for Enterococcus, MSSA and ESBL. Initial end date was set for 11/20, however due to delays in obtaining her CT scan antibiotics were extended to 11/27. Follow up CT scan without residual or recurrent peripubic abscess and stable erosive changes of the symphysis pubis consistent with evolving/healing septic arthritis/osteomyelitis.   Natalie Osborn continues to take the meropenem as prescribed with no adverse side effects. PICC line remains in place with no evidence of infection. She is feeling better today with some pain located in her left groin on occasion. No fevers, chills, or sweats.   No Known Allergies    Outpatient Medications Prior to Visit  Medication Sig Dispense Refill  . calcium carbonate 1250 MG capsule Take 1,250 mg by mouth 2 (two) times daily with a meal.    . ferrous fumarate (HEMOCYTE - 106 MG FE) 325 (106 FE) MG TABS tablet Take 1 tablet by mouth.    . folic acid (FOLVITE) 1 MG tablet Take 2 mg by mouth 2 (two) times daily.    Marland Kitchen MEROPENEM IV Inject into the vein.    Marland Kitchen omeprazole (PRILOSEC) 40 MG capsule Take 40 mg by mouth daily.    . methotrexate (50 MG/ML) 1 g injection Inject 0.8 mg into the vein once a week.     No facility-administered medications prior to visit.      Past Medical History:  Diagnosis Date  . Rheumatoid arthritis [facility])      Past Surgical History:  Procedure Laterality Date  . BREAST LUMPECTOMY  1988    Review of  Systems  Constitutional: Negative for chills and fever.  Respiratory: Negative for chest tightness, shortness of breath and wheezing.   Cardiovascular: Negative for chest pain.  Genitourinary: Positive for pelvic pain. Negative for dysuria, flank pain, frequency and hematuria.  Skin: Negative for rash.      Objective:    BP (!) 146/81   Pulse 90   Temp 98.6 F (37 C)   Wt 176 lb 12.8 oz (80.2 kg)   BMI 29.42 kg/m  Nursing note and vital signs reviewed.  Physical Exam  Constitutional: She is oriented to person, place, and time. She appears well-developed and well-nourished. No distress.  Cardiovascular: Normal rate, regular rhythm, normal heart sounds and intact distal pulses.  Pulmonary/Chest: Effort normal and breath sounds normal.  Neurological: She is alert and oriented to person, place, and time.  Skin: Skin is warm and dry.  Psychiatric: She has a normal mood and affect. Her behavior is normal. Judgment and thought content normal.       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 Will check inflammatory markers today which may be confounding secondary to underlying osteoarthritis. Clinically she is doing very well and does not appear  to need additional antibiotics at the completion of therapy on 11/29 which will total 7 weeks. If concern remains will consider treatment with linezolid. Orders for PICC line removal sent to [facility] with confirmation of end date with [facility].       Relevant Orders   Sedimentation rate   C-reactive protein     Genitourinary   Abscess, bladder    CT scan with resolution of previous bladder abscesses. No further treatment indicated at this time.           I am having Alan Mulder maintain her calcium carbonate, ferrous fumarate, omeprazole, methotrexate, folic  acid, and MEROPENEM IV.   Follow-up: As needed pending lab work results.   Terri Piedra, MSN, FNP-C Nurse Practitioner [facility] Office phone: [phone] Pager: [place] number: [phone]

## 2018-07-20 NOTE — Assessment & Plan Note (Signed)
CT scan with resolution of previous bladder abscesses. No further treatment indicated at this time.

## 2018-07-20 NOTE — Assessment & Plan Note (Addendum)
CT scan with evolving/healing symphysis pubis osteomyelitis. Will check inflammatory markers today which may be confounding secondary to underlying osteoarthritis. Clinically she is doing very well and does not appear to need additional antibiotics at the completion of therapy on 11/29 which will total 7 weeks. If concern remains will consider treatment with linezolid. Orders for PICC line removal sent to [facility] with confirmation of end date with [facility].

## 2018-07-20 NOTE — Telephone Encounter (Signed)
Per Terri Piedra, NP contacted [facility] and faxed orders to have picc line pulled on  07/24/2018 after last dose on 07/23/18. Fax sent successfully.

## 2018-07-20 NOTE — Patient Instructions (Addendum)
Nice to see you.  Continue your meropenem until 11/29 and they should be removing your PICC line.   We will check your inflammatory markers today and decide if any additional antibiotics are needed.  Will plan follow up as needed.

## 2018-07-21 DIAGNOSIS — N3090 Cystitis, unspecified without hematuria: Secondary | ICD-10-CM | POA: Diagnosis not present

## 2018-07-21 DIAGNOSIS — M869 Osteomyelitis, unspecified: Secondary | ICD-10-CM | POA: Diagnosis not present

## 2018-07-21 LAB — SEDIMENTATION RATE: Sed Rate: 31 mm/h — ABNORMAL HIGH (ref 0–30)

## 2018-07-21 LAB — C-REACTIVE PROTEIN: CRP: 14.7 mg/L — AB (ref <8.0)

## 2018-07-22 DIAGNOSIS — N3090 Cystitis, unspecified without hematuria: Secondary | ICD-10-CM | POA: Diagnosis not present

## 2018-07-22 DIAGNOSIS — M869 Osteomyelitis, unspecified: Secondary | ICD-10-CM | POA: Diagnosis not present

## 2018-07-23 DIAGNOSIS — M869 Osteomyelitis, unspecified: Secondary | ICD-10-CM | POA: Diagnosis not present

## 2018-07-23 DIAGNOSIS — N3090 Cystitis, unspecified without hematuria: Secondary | ICD-10-CM | POA: Diagnosis not present

## 2018-07-29 DIAGNOSIS — N390 Urinary tract infection, site not specified: Secondary | ICD-10-CM | POA: Diagnosis not present

## 2018-07-29 DIAGNOSIS — B9561 Methicillin susceptible Staphylococcus aureus infection as the cause of diseases classified elsewhere: Secondary | ICD-10-CM | POA: Diagnosis not present

## 2018-07-29 DIAGNOSIS — D638 Anemia in other chronic diseases classified elsewhere: Secondary | ICD-10-CM | POA: Diagnosis not present

## 2018-07-29 DIAGNOSIS — M069 Rheumatoid arthritis, unspecified: Secondary | ICD-10-CM | POA: Diagnosis not present

## 2018-07-29 DIAGNOSIS — N739 Female pelvic inflammatory disease, unspecified: Secondary | ICD-10-CM | POA: Diagnosis not present

## 2018-07-29 DIAGNOSIS — Z87891 Personal history of nicotine dependence: Secondary | ICD-10-CM | POA: Diagnosis not present

## 2018-07-29 DIAGNOSIS — R911 Solitary pulmonary nodule: Secondary | ICD-10-CM | POA: Diagnosis not present

## 2018-07-29 DIAGNOSIS — M8618 Other acute osteomyelitis, other site: Secondary | ICD-10-CM | POA: Diagnosis not present

## 2018-07-29 DIAGNOSIS — B952 Enterococcus as the cause of diseases classified elsewhere: Secondary | ICD-10-CM | POA: Diagnosis not present

## 2018-07-29 DIAGNOSIS — Z452 Encounter for adjustment and management of vascular access device: Secondary | ICD-10-CM | POA: Diagnosis not present

## 2018-08-13 DIAGNOSIS — M869 Osteomyelitis, unspecified: Secondary | ICD-10-CM | POA: Diagnosis not present

## 2018-08-13 DIAGNOSIS — L304 Erythema intertrigo: Secondary | ICD-10-CM | POA: Diagnosis not present

## 2018-08-13 DIAGNOSIS — M069 Rheumatoid arthritis, unspecified: Secondary | ICD-10-CM | POA: Diagnosis not present

## 2018-08-13 DIAGNOSIS — Z6828 Body mass index (BMI) 28.0-28.9, adult: Secondary | ICD-10-CM | POA: Diagnosis not present

## 2018-12-28 DIAGNOSIS — M130 Polyarthritis, unspecified: Secondary | ICD-10-CM | POA: Diagnosis not present

## 2018-12-28 DIAGNOSIS — M0589 Other rheumatoid arthritis with rheumatoid factor of multiple sites: Secondary | ICD-10-CM | POA: Diagnosis not present

## 2018-12-28 DIAGNOSIS — M791 Myalgia, unspecified site: Secondary | ICD-10-CM | POA: Diagnosis not present

## 2018-12-28 DIAGNOSIS — M79643 Pain in unspecified hand: Secondary | ICD-10-CM | POA: Diagnosis not present

## 2018-12-28 DIAGNOSIS — Z79899 Other long term (current) drug therapy: Secondary | ICD-10-CM | POA: Diagnosis not present

## 2018-12-28 DIAGNOSIS — M1990 Unspecified osteoarthritis, unspecified site: Secondary | ICD-10-CM | POA: Diagnosis not present

## 2019-01-19 DIAGNOSIS — M069 Rheumatoid arthritis, unspecified: Secondary | ICD-10-CM | POA: Diagnosis not present

## 2019-01-19 DIAGNOSIS — Z6826 Body mass index (BMI) 26.0-26.9, adult: Secondary | ICD-10-CM | POA: Diagnosis not present

## 2019-01-19 DIAGNOSIS — K219 Gastro-esophageal reflux disease without esophagitis: Secondary | ICD-10-CM | POA: Diagnosis not present

## 2019-01-19 DIAGNOSIS — Z87440 Personal history of urinary (tract) infections: Secondary | ICD-10-CM | POA: Diagnosis not present

## 2019-01-19 DIAGNOSIS — N3000 Acute cystitis without hematuria: Secondary | ICD-10-CM | POA: Diagnosis not present

## 2019-01-25 DIAGNOSIS — M1990 Unspecified osteoarthritis, unspecified site: Secondary | ICD-10-CM | POA: Diagnosis not present

## 2019-01-25 DIAGNOSIS — N390 Urinary tract infection, site not specified: Secondary | ICD-10-CM | POA: Diagnosis not present

## 2019-01-25 DIAGNOSIS — M0589 Other rheumatoid arthritis with rheumatoid factor of multiple sites: Secondary | ICD-10-CM | POA: Diagnosis not present

## 2019-01-25 DIAGNOSIS — Z79899 Other long term (current) drug therapy: Secondary | ICD-10-CM | POA: Diagnosis not present

## 2019-01-25 DIAGNOSIS — M25461 Effusion, right knee: Secondary | ICD-10-CM | POA: Diagnosis not present

## 2019-01-25 DIAGNOSIS — M791 Myalgia, unspecified site: Secondary | ICD-10-CM | POA: Diagnosis not present

## 2019-01-25 DIAGNOSIS — M130 Polyarthritis, unspecified: Secondary | ICD-10-CM | POA: Diagnosis not present

## 2019-01-25 DIAGNOSIS — M79643 Pain in unspecified hand: Secondary | ICD-10-CM | POA: Diagnosis not present

## 2019-02-02 DIAGNOSIS — R509 Fever, unspecified: Secondary | ICD-10-CM | POA: Diagnosis not present

## 2019-02-02 DIAGNOSIS — R21 Rash and other nonspecific skin eruption: Secondary | ICD-10-CM | POA: Diagnosis not present

## 2019-02-02 DIAGNOSIS — Z8619 Personal history of other infectious and parasitic diseases: Secondary | ICD-10-CM | POA: Diagnosis not present

## 2019-02-02 DIAGNOSIS — L550 Sunburn of first degree: Secondary | ICD-10-CM | POA: Diagnosis not present

## 2019-02-02 DIAGNOSIS — Z03818 Encounter for observation for suspected exposure to other biological agents ruled out: Secondary | ICD-10-CM | POA: Diagnosis not present

## 2019-02-02 DIAGNOSIS — Z20828 Contact with and (suspected) exposure to other viral communicable diseases: Secondary | ICD-10-CM | POA: Diagnosis not present

## 2019-02-04 DIAGNOSIS — Z6826 Body mass index (BMI) 26.0-26.9, adult: Secondary | ICD-10-CM | POA: Diagnosis not present

## 2019-02-04 DIAGNOSIS — R110 Nausea: Secondary | ICD-10-CM | POA: Diagnosis not present

## 2019-02-04 DIAGNOSIS — R51 Headache: Secondary | ICD-10-CM | POA: Diagnosis not present

## 2019-02-09 DIAGNOSIS — M0589 Other rheumatoid arthritis with rheumatoid factor of multiple sites: Secondary | ICD-10-CM | POA: Diagnosis not present

## 2019-02-09 DIAGNOSIS — M791 Myalgia, unspecified site: Secondary | ICD-10-CM | POA: Diagnosis not present

## 2019-02-09 DIAGNOSIS — M25461 Effusion, right knee: Secondary | ICD-10-CM | POA: Diagnosis not present

## 2019-02-09 DIAGNOSIS — M7552 Bursitis of left shoulder: Secondary | ICD-10-CM | POA: Diagnosis not present

## 2019-02-09 DIAGNOSIS — M130 Polyarthritis, unspecified: Secondary | ICD-10-CM | POA: Diagnosis not present

## 2019-02-09 DIAGNOSIS — M79643 Pain in unspecified hand: Secondary | ICD-10-CM | POA: Diagnosis not present

## 2019-02-09 DIAGNOSIS — M7551 Bursitis of right shoulder: Secondary | ICD-10-CM | POA: Diagnosis not present

## 2019-02-09 DIAGNOSIS — M1990 Unspecified osteoarthritis, unspecified site: Secondary | ICD-10-CM | POA: Diagnosis not present

## 2019-02-09 DIAGNOSIS — Z79899 Other long term (current) drug therapy: Secondary | ICD-10-CM | POA: Diagnosis not present

## 2019-02-09 DIAGNOSIS — N390 Urinary tract infection, site not specified: Secondary | ICD-10-CM | POA: Diagnosis not present

## 2019-03-25 DIAGNOSIS — M21612 Bunion of left foot: Secondary | ICD-10-CM | POA: Diagnosis not present

## 2019-03-25 DIAGNOSIS — M21611 Bunion of right foot: Secondary | ICD-10-CM | POA: Diagnosis not present

## 2019-03-25 DIAGNOSIS — M2042 Other hammer toe(s) (acquired), left foot: Secondary | ICD-10-CM | POA: Diagnosis not present

## 2019-03-25 DIAGNOSIS — M2041 Other hammer toe(s) (acquired), right foot: Secondary | ICD-10-CM | POA: Diagnosis not present

## 2019-04-27 DIAGNOSIS — N390 Urinary tract infection, site not specified: Secondary | ICD-10-CM | POA: Diagnosis not present

## 2019-04-27 DIAGNOSIS — M810 Age-related osteoporosis without current pathological fracture: Secondary | ICD-10-CM | POA: Diagnosis not present

## 2019-04-27 DIAGNOSIS — M7552 Bursitis of left shoulder: Secondary | ICD-10-CM | POA: Diagnosis not present

## 2019-04-27 DIAGNOSIS — M791 Myalgia, unspecified site: Secondary | ICD-10-CM | POA: Diagnosis not present

## 2019-04-27 DIAGNOSIS — M0589 Other rheumatoid arthritis with rheumatoid factor of multiple sites: Secondary | ICD-10-CM | POA: Diagnosis not present

## 2019-04-27 DIAGNOSIS — M1990 Unspecified osteoarthritis, unspecified site: Secondary | ICD-10-CM | POA: Diagnosis not present

## 2019-04-27 DIAGNOSIS — M79643 Pain in unspecified hand: Secondary | ICD-10-CM | POA: Diagnosis not present

## 2019-04-27 DIAGNOSIS — Z79899 Other long term (current) drug therapy: Secondary | ICD-10-CM | POA: Diagnosis not present

## 2019-04-27 DIAGNOSIS — M7551 Bursitis of right shoulder: Secondary | ICD-10-CM | POA: Diagnosis not present

## 2019-04-27 DIAGNOSIS — M130 Polyarthritis, unspecified: Secondary | ICD-10-CM | POA: Diagnosis not present

## 2019-06-01 DIAGNOSIS — R69 Illness, unspecified: Secondary | ICD-10-CM | POA: Diagnosis not present

## 2019-06-02 DIAGNOSIS — R69 Illness, unspecified: Secondary | ICD-10-CM | POA: Diagnosis not present

## 2019-06-07 DIAGNOSIS — Z23 Encounter for immunization: Secondary | ICD-10-CM | POA: Diagnosis not present

## 2019-07-01 DIAGNOSIS — M0589 Other rheumatoid arthritis with rheumatoid factor of multiple sites: Secondary | ICD-10-CM | POA: Diagnosis not present

## 2019-07-01 DIAGNOSIS — M25571 Pain in right ankle and joints of right foot: Secondary | ICD-10-CM | POA: Diagnosis not present

## 2019-07-01 DIAGNOSIS — M25472 Effusion, left ankle: Secondary | ICD-10-CM | POA: Diagnosis not present

## 2019-07-01 DIAGNOSIS — N390 Urinary tract infection, site not specified: Secondary | ICD-10-CM | POA: Diagnosis not present

## 2019-07-01 DIAGNOSIS — M069 Rheumatoid arthritis, unspecified: Secondary | ICD-10-CM | POA: Diagnosis not present

## 2019-07-01 DIAGNOSIS — M7731 Calcaneal spur, right foot: Secondary | ICD-10-CM | POA: Diagnosis not present

## 2019-07-01 DIAGNOSIS — M25572 Pain in left ankle and joints of left foot: Secondary | ICD-10-CM | POA: Diagnosis not present

## 2019-07-01 DIAGNOSIS — M7732 Calcaneal spur, left foot: Secondary | ICD-10-CM | POA: Diagnosis not present

## 2019-07-01 DIAGNOSIS — Z79899 Other long term (current) drug therapy: Secondary | ICD-10-CM | POA: Diagnosis not present

## 2019-07-01 DIAGNOSIS — M79643 Pain in unspecified hand: Secondary | ICD-10-CM | POA: Diagnosis not present

## 2019-07-01 DIAGNOSIS — M79671 Pain in right foot: Secondary | ICD-10-CM | POA: Diagnosis not present

## 2019-07-01 DIAGNOSIS — M1990 Unspecified osteoarthritis, unspecified site: Secondary | ICD-10-CM | POA: Diagnosis not present

## 2019-07-01 DIAGNOSIS — M130 Polyarthritis, unspecified: Secondary | ICD-10-CM | POA: Diagnosis not present

## 2019-07-01 DIAGNOSIS — M79672 Pain in left foot: Secondary | ICD-10-CM | POA: Diagnosis not present

## 2019-07-01 DIAGNOSIS — M810 Age-related osteoporosis without current pathological fracture: Secondary | ICD-10-CM | POA: Diagnosis not present

## 2019-07-07 DIAGNOSIS — M545 Low back pain: Secondary | ICD-10-CM | POA: Diagnosis not present

## 2019-07-07 DIAGNOSIS — N390 Urinary tract infection, site not specified: Secondary | ICD-10-CM | POA: Diagnosis not present

## 2019-07-07 DIAGNOSIS — M869 Osteomyelitis, unspecified: Secondary | ICD-10-CM | POA: Diagnosis not present

## 2019-07-07 DIAGNOSIS — Z87440 Personal history of urinary (tract) infections: Secondary | ICD-10-CM | POA: Diagnosis not present

## 2019-07-07 DIAGNOSIS — Z6828 Body mass index (BMI) 28.0-28.9, adult: Secondary | ICD-10-CM | POA: Diagnosis not present

## 2019-07-07 DIAGNOSIS — M069 Rheumatoid arthritis, unspecified: Secondary | ICD-10-CM | POA: Diagnosis not present

## 2019-07-20 DIAGNOSIS — R109 Unspecified abdominal pain: Secondary | ICD-10-CM | POA: Diagnosis not present

## 2019-07-20 DIAGNOSIS — M8530 Osteitis condensans, unspecified site: Secondary | ICD-10-CM | POA: Diagnosis not present

## 2019-07-20 DIAGNOSIS — M869 Osteomyelitis, unspecified: Secondary | ICD-10-CM | POA: Diagnosis not present

## 2019-07-25 DIAGNOSIS — H524 Presbyopia: Secondary | ICD-10-CM | POA: Diagnosis not present

## 2019-07-25 DIAGNOSIS — H52209 Unspecified astigmatism, unspecified eye: Secondary | ICD-10-CM | POA: Diagnosis not present

## 2019-07-25 DIAGNOSIS — H5203 Hypermetropia, bilateral: Secondary | ICD-10-CM | POA: Diagnosis not present

## 2019-07-27 DIAGNOSIS — M1990 Unspecified osteoarthritis, unspecified site: Secondary | ICD-10-CM | POA: Diagnosis not present

## 2019-07-27 DIAGNOSIS — M0589 Other rheumatoid arthritis with rheumatoid factor of multiple sites: Secondary | ICD-10-CM | POA: Diagnosis not present

## 2019-07-27 DIAGNOSIS — M130 Polyarthritis, unspecified: Secondary | ICD-10-CM | POA: Diagnosis not present

## 2019-07-27 DIAGNOSIS — M810 Age-related osteoporosis without current pathological fracture: Secondary | ICD-10-CM | POA: Diagnosis not present

## 2019-07-27 DIAGNOSIS — M79643 Pain in unspecified hand: Secondary | ICD-10-CM | POA: Diagnosis not present

## 2019-07-27 DIAGNOSIS — Z79899 Other long term (current) drug therapy: Secondary | ICD-10-CM | POA: Diagnosis not present

## 2019-07-27 DIAGNOSIS — M069 Rheumatoid arthritis, unspecified: Secondary | ICD-10-CM | POA: Diagnosis not present

## 2019-08-09 DIAGNOSIS — R69 Illness, unspecified: Secondary | ICD-10-CM | POA: Diagnosis not present

## 2019-08-10 DIAGNOSIS — R69 Illness, unspecified: Secondary | ICD-10-CM | POA: Diagnosis not present

## 2019-08-10 DIAGNOSIS — Z6829 Body mass index (BMI) 29.0-29.9, adult: Secondary | ICD-10-CM | POA: Diagnosis not present

## 2019-08-10 DIAGNOSIS — K602 Anal fissure, unspecified: Secondary | ICD-10-CM | POA: Diagnosis not present

## 2019-10-25 DIAGNOSIS — Z79899 Other long term (current) drug therapy: Secondary | ICD-10-CM | POA: Diagnosis not present

## 2019-10-25 DIAGNOSIS — M810 Age-related osteoporosis without current pathological fracture: Secondary | ICD-10-CM | POA: Diagnosis not present

## 2019-10-25 DIAGNOSIS — M79643 Pain in unspecified hand: Secondary | ICD-10-CM | POA: Diagnosis not present

## 2019-10-25 DIAGNOSIS — M0589 Other rheumatoid arthritis with rheumatoid factor of multiple sites: Secondary | ICD-10-CM | POA: Diagnosis not present

## 2019-10-25 DIAGNOSIS — M1990 Unspecified osteoarthritis, unspecified site: Secondary | ICD-10-CM | POA: Diagnosis not present

## 2019-11-14 DIAGNOSIS — K602 Anal fissure, unspecified: Secondary | ICD-10-CM | POA: Diagnosis not present

## 2019-11-14 DIAGNOSIS — Z6832 Body mass index (BMI) 32.0-32.9, adult: Secondary | ICD-10-CM | POA: Diagnosis not present

## 2019-11-14 DIAGNOSIS — K6289 Other specified diseases of anus and rectum: Secondary | ICD-10-CM | POA: Diagnosis not present

## 2019-11-14 DIAGNOSIS — Z1211 Encounter for screening for malignant neoplasm of colon: Secondary | ICD-10-CM | POA: Diagnosis not present

## 2019-11-16 DIAGNOSIS — Z01812 Encounter for preprocedural laboratory examination: Secondary | ICD-10-CM | POA: Diagnosis not present

## 2019-11-16 DIAGNOSIS — K6289 Other specified diseases of anus and rectum: Secondary | ICD-10-CM | POA: Diagnosis not present

## 2019-11-16 DIAGNOSIS — Z20822 Contact with and (suspected) exposure to covid-19: Secondary | ICD-10-CM | POA: Diagnosis not present

## 2019-11-23 DIAGNOSIS — Z87891 Personal history of nicotine dependence: Secondary | ICD-10-CM | POA: Diagnosis not present

## 2019-11-23 DIAGNOSIS — K6289 Other specified diseases of anus and rectum: Secondary | ICD-10-CM | POA: Diagnosis not present

## 2019-11-23 DIAGNOSIS — K602 Anal fissure, unspecified: Secondary | ICD-10-CM | POA: Diagnosis not present

## 2019-11-23 DIAGNOSIS — Z7952 Long term (current) use of systemic steroids: Secondary | ICD-10-CM | POA: Diagnosis not present

## 2019-11-23 DIAGNOSIS — M069 Rheumatoid arthritis, unspecified: Secondary | ICD-10-CM | POA: Diagnosis not present

## 2019-11-23 DIAGNOSIS — K624 Stenosis of anus and rectum: Secondary | ICD-10-CM | POA: Diagnosis not present

## 2019-11-23 DIAGNOSIS — E669 Obesity, unspecified: Secondary | ICD-10-CM | POA: Diagnosis not present

## 2019-11-23 DIAGNOSIS — J449 Chronic obstructive pulmonary disease, unspecified: Secondary | ICD-10-CM | POA: Diagnosis not present

## 2019-11-23 DIAGNOSIS — R69 Illness, unspecified: Secondary | ICD-10-CM | POA: Diagnosis not present

## 2019-11-23 DIAGNOSIS — K219 Gastro-esophageal reflux disease without esophagitis: Secondary | ICD-10-CM | POA: Diagnosis not present

## 2019-11-23 DIAGNOSIS — Z79899 Other long term (current) drug therapy: Secondary | ICD-10-CM | POA: Diagnosis not present

## 2019-12-05 DIAGNOSIS — K602 Anal fissure, unspecified: Secondary | ICD-10-CM | POA: Diagnosis not present

## 2019-12-05 DIAGNOSIS — Z09 Encounter for follow-up examination after completed treatment for conditions other than malignant neoplasm: Secondary | ICD-10-CM | POA: Diagnosis not present

## 2020-01-31 DIAGNOSIS — M0589 Other rheumatoid arthritis with rheumatoid factor of multiple sites: Secondary | ICD-10-CM | POA: Diagnosis not present

## 2020-01-31 DIAGNOSIS — Z79899 Other long term (current) drug therapy: Secondary | ICD-10-CM | POA: Diagnosis not present

## 2020-01-31 DIAGNOSIS — M79643 Pain in unspecified hand: Secondary | ICD-10-CM | POA: Diagnosis not present

## 2020-01-31 DIAGNOSIS — M810 Age-related osteoporosis without current pathological fracture: Secondary | ICD-10-CM | POA: Diagnosis not present

## 2020-01-31 DIAGNOSIS — M1990 Unspecified osteoarthritis, unspecified site: Secondary | ICD-10-CM | POA: Diagnosis not present

## 2020-03-05 DIAGNOSIS — Z1211 Encounter for screening for malignant neoplasm of colon: Secondary | ICD-10-CM | POA: Diagnosis not present

## 2020-03-05 DIAGNOSIS — Z6831 Body mass index (BMI) 31.0-31.9, adult: Secondary | ICD-10-CM | POA: Diagnosis not present

## 2020-03-05 DIAGNOSIS — Z1159 Encounter for screening for other viral diseases: Secondary | ICD-10-CM | POA: Diagnosis not present

## 2020-03-08 DIAGNOSIS — Z01812 Encounter for preprocedural laboratory examination: Secondary | ICD-10-CM | POA: Diagnosis not present

## 2020-03-08 DIAGNOSIS — Z20822 Contact with and (suspected) exposure to covid-19: Secondary | ICD-10-CM | POA: Diagnosis not present

## 2020-03-08 DIAGNOSIS — Z1211 Encounter for screening for malignant neoplasm of colon: Secondary | ICD-10-CM | POA: Diagnosis not present

## 2020-03-08 DIAGNOSIS — Z8371 Family history of colonic polyps: Secondary | ICD-10-CM | POA: Diagnosis not present

## 2020-03-15 DIAGNOSIS — Z7952 Long term (current) use of systemic steroids: Secondary | ICD-10-CM | POA: Diagnosis not present

## 2020-03-15 DIAGNOSIS — M069 Rheumatoid arthritis, unspecified: Secondary | ICD-10-CM | POA: Diagnosis not present

## 2020-03-15 DIAGNOSIS — Z79899 Other long term (current) drug therapy: Secondary | ICD-10-CM | POA: Diagnosis not present

## 2020-03-15 DIAGNOSIS — Z1211 Encounter for screening for malignant neoplasm of colon: Secondary | ICD-10-CM | POA: Diagnosis not present

## 2020-03-15 DIAGNOSIS — K219 Gastro-esophageal reflux disease without esophagitis: Secondary | ICD-10-CM | POA: Diagnosis not present

## 2020-05-02 DIAGNOSIS — M810 Age-related osteoporosis without current pathological fracture: Secondary | ICD-10-CM | POA: Diagnosis not present

## 2020-05-02 DIAGNOSIS — M0589 Other rheumatoid arthritis with rheumatoid factor of multiple sites: Secondary | ICD-10-CM | POA: Diagnosis not present

## 2020-05-02 DIAGNOSIS — M1990 Unspecified osteoarthritis, unspecified site: Secondary | ICD-10-CM | POA: Diagnosis not present

## 2020-05-02 DIAGNOSIS — Z79899 Other long term (current) drug therapy: Secondary | ICD-10-CM | POA: Diagnosis not present

## 2020-05-02 DIAGNOSIS — M79643 Pain in unspecified hand: Secondary | ICD-10-CM | POA: Diagnosis not present

## 2020-06-06 DIAGNOSIS — I129 Hypertensive chronic kidney disease with stage 1 through stage 4 chronic kidney disease, or unspecified chronic kidney disease: Secondary | ICD-10-CM | POA: Diagnosis not present

## 2020-06-06 DIAGNOSIS — E1122 Type 2 diabetes mellitus with diabetic chronic kidney disease: Secondary | ICD-10-CM | POA: Diagnosis not present

## 2020-06-06 DIAGNOSIS — R69 Illness, unspecified: Secondary | ICD-10-CM | POA: Diagnosis not present

## 2020-07-09 DIAGNOSIS — L03031 Cellulitis of right toe: Secondary | ICD-10-CM | POA: Diagnosis not present

## 2020-07-09 DIAGNOSIS — Z6830 Body mass index (BMI) 30.0-30.9, adult: Secondary | ICD-10-CM | POA: Diagnosis not present

## 2020-07-09 DIAGNOSIS — S99921A Unspecified injury of right foot, initial encounter: Secondary | ICD-10-CM | POA: Diagnosis not present

## 2020-07-09 DIAGNOSIS — M2012 Hallux valgus (acquired), left foot: Secondary | ICD-10-CM | POA: Diagnosis not present

## 2020-07-09 DIAGNOSIS — Z23 Encounter for immunization: Secondary | ICD-10-CM | POA: Diagnosis not present

## 2020-07-09 DIAGNOSIS — S91103A Unspecified open wound of unspecified great toe without damage to nail, initial encounter: Secondary | ICD-10-CM | POA: Diagnosis not present

## 2020-07-09 DIAGNOSIS — M2011 Hallux valgus (acquired), right foot: Secondary | ICD-10-CM | POA: Diagnosis not present

## 2020-07-27 DIAGNOSIS — H52209 Unspecified astigmatism, unspecified eye: Secondary | ICD-10-CM | POA: Diagnosis not present

## 2020-07-27 DIAGNOSIS — H524 Presbyopia: Secondary | ICD-10-CM | POA: Diagnosis not present

## 2020-07-27 DIAGNOSIS — H5203 Hypermetropia, bilateral: Secondary | ICD-10-CM | POA: Diagnosis not present

## 2020-07-31 DIAGNOSIS — M0589 Other rheumatoid arthritis with rheumatoid factor of multiple sites: Secondary | ICD-10-CM | POA: Diagnosis not present

## 2020-08-15 DIAGNOSIS — M0589 Other rheumatoid arthritis with rheumatoid factor of multiple sites: Secondary | ICD-10-CM | POA: Diagnosis not present

## 2020-08-28 DIAGNOSIS — M79643 Pain in unspecified hand: Secondary | ICD-10-CM | POA: Diagnosis not present

## 2020-08-28 DIAGNOSIS — M1990 Unspecified osteoarthritis, unspecified site: Secondary | ICD-10-CM | POA: Diagnosis not present

## 2020-08-28 DIAGNOSIS — M0589 Other rheumatoid arthritis with rheumatoid factor of multiple sites: Secondary | ICD-10-CM | POA: Diagnosis not present

## 2020-08-28 DIAGNOSIS — Z79899 Other long term (current) drug therapy: Secondary | ICD-10-CM | POA: Diagnosis not present

## 2020-08-28 DIAGNOSIS — M810 Age-related osteoporosis without current pathological fracture: Secondary | ICD-10-CM | POA: Diagnosis not present

## 2020-08-30 DIAGNOSIS — M0589 Other rheumatoid arthritis with rheumatoid factor of multiple sites: Secondary | ICD-10-CM | POA: Diagnosis not present

## 2020-09-27 DIAGNOSIS — M0589 Other rheumatoid arthritis with rheumatoid factor of multiple sites: Secondary | ICD-10-CM | POA: Diagnosis not present

## 2020-10-25 DIAGNOSIS — M0589 Other rheumatoid arthritis with rheumatoid factor of multiple sites: Secondary | ICD-10-CM | POA: Diagnosis not present

## 2020-11-13 DIAGNOSIS — S8992XA Unspecified injury of left lower leg, initial encounter: Secondary | ICD-10-CM | POA: Diagnosis not present

## 2020-11-13 DIAGNOSIS — S82122A Displaced fracture of lateral condyle of left tibia, initial encounter for closed fracture: Secondary | ICD-10-CM | POA: Diagnosis not present

## 2020-11-13 DIAGNOSIS — W19XXXA Unspecified fall, initial encounter: Secondary | ICD-10-CM | POA: Diagnosis not present

## 2020-11-13 DIAGNOSIS — S82141A Displaced bicondylar fracture of right tibia, initial encounter for closed fracture: Secondary | ICD-10-CM | POA: Diagnosis not present

## 2020-11-13 DIAGNOSIS — Z043 Encounter for examination and observation following other accident: Secondary | ICD-10-CM | POA: Diagnosis not present

## 2020-11-13 DIAGNOSIS — S82142A Displaced bicondylar fracture of left tibia, initial encounter for closed fracture: Secondary | ICD-10-CM | POA: Diagnosis not present

## 2020-11-13 DIAGNOSIS — S8991XA Unspecified injury of right lower leg, initial encounter: Secondary | ICD-10-CM | POA: Diagnosis not present

## 2020-11-13 DIAGNOSIS — R0902 Hypoxemia: Secondary | ICD-10-CM | POA: Diagnosis not present

## 2020-11-14 DIAGNOSIS — W010XXA Fall on same level from slipping, tripping and stumbling without subsequent striking against object, initial encounter: Secondary | ICD-10-CM | POA: Diagnosis not present

## 2020-11-14 DIAGNOSIS — M80061A Age-related osteoporosis with current pathological fracture, right lower leg, initial encounter for fracture: Secondary | ICD-10-CM | POA: Diagnosis not present

## 2020-11-14 DIAGNOSIS — M80062A Age-related osteoporosis with current pathological fracture, left lower leg, initial encounter for fracture: Secondary | ICD-10-CM | POA: Diagnosis not present

## 2020-11-14 DIAGNOSIS — W19XXXA Unspecified fall, initial encounter: Secondary | ICD-10-CM | POA: Diagnosis not present

## 2020-11-14 DIAGNOSIS — S82142A Displaced bicondylar fracture of left tibia, initial encounter for closed fracture: Secondary | ICD-10-CM | POA: Diagnosis not present

## 2020-11-14 DIAGNOSIS — Z7982 Long term (current) use of aspirin: Secondary | ICD-10-CM | POA: Diagnosis not present

## 2020-11-14 DIAGNOSIS — M069 Rheumatoid arthritis, unspecified: Secondary | ICD-10-CM | POA: Diagnosis not present

## 2020-11-14 DIAGNOSIS — S8991XA Unspecified injury of right lower leg, initial encounter: Secondary | ICD-10-CM | POA: Diagnosis not present

## 2020-11-14 DIAGNOSIS — S82141A Displaced bicondylar fracture of right tibia, initial encounter for closed fracture: Secondary | ICD-10-CM | POA: Diagnosis not present

## 2020-11-14 DIAGNOSIS — Z043 Encounter for examination and observation following other accident: Secondary | ICD-10-CM | POA: Diagnosis not present

## 2020-11-14 DIAGNOSIS — G8918 Other acute postprocedural pain: Secondary | ICD-10-CM | POA: Diagnosis not present

## 2020-11-14 DIAGNOSIS — S82122A Displaced fracture of lateral condyle of left tibia, initial encounter for closed fracture: Secondary | ICD-10-CM | POA: Diagnosis not present

## 2020-11-14 DIAGNOSIS — D509 Iron deficiency anemia, unspecified: Secondary | ICD-10-CM | POA: Diagnosis not present

## 2020-11-14 DIAGNOSIS — K219 Gastro-esophageal reflux disease without esophagitis: Secondary | ICD-10-CM | POA: Diagnosis not present

## 2020-11-14 DIAGNOSIS — Z993 Dependence on wheelchair: Secondary | ICD-10-CM | POA: Diagnosis not present

## 2020-11-14 DIAGNOSIS — S82201A Unspecified fracture of shaft of right tibia, initial encounter for closed fracture: Secondary | ICD-10-CM | POA: Diagnosis not present

## 2020-11-14 DIAGNOSIS — R0982 Postnasal drip: Secondary | ICD-10-CM | POA: Diagnosis not present

## 2020-11-14 DIAGNOSIS — S8992XA Unspecified injury of left lower leg, initial encounter: Secondary | ICD-10-CM | POA: Diagnosis not present

## 2020-11-15 DIAGNOSIS — M80061A Age-related osteoporosis with current pathological fracture, right lower leg, initial encounter for fracture: Secondary | ICD-10-CM | POA: Diagnosis not present

## 2020-11-15 DIAGNOSIS — G8918 Other acute postprocedural pain: Secondary | ICD-10-CM | POA: Diagnosis not present

## 2020-11-15 DIAGNOSIS — S82142A Displaced bicondylar fracture of left tibia, initial encounter for closed fracture: Secondary | ICD-10-CM | POA: Diagnosis not present

## 2020-11-15 DIAGNOSIS — S82141A Displaced bicondylar fracture of right tibia, initial encounter for closed fracture: Secondary | ICD-10-CM | POA: Diagnosis not present

## 2020-11-15 DIAGNOSIS — M80062A Age-related osteoporosis with current pathological fracture, left lower leg, initial encounter for fracture: Secondary | ICD-10-CM | POA: Diagnosis not present

## 2020-11-15 DIAGNOSIS — W010XXA Fall on same level from slipping, tripping and stumbling without subsequent striking against object, initial encounter: Secondary | ICD-10-CM | POA: Diagnosis not present

## 2020-11-15 DIAGNOSIS — M069 Rheumatoid arthritis, unspecified: Secondary | ICD-10-CM | POA: Diagnosis not present

## 2020-11-16 DIAGNOSIS — S82142A Displaced bicondylar fracture of left tibia, initial encounter for closed fracture: Secondary | ICD-10-CM | POA: Diagnosis not present

## 2020-11-16 DIAGNOSIS — W010XXA Fall on same level from slipping, tripping and stumbling without subsequent striking against object, initial encounter: Secondary | ICD-10-CM | POA: Diagnosis not present

## 2020-11-16 DIAGNOSIS — S82141A Displaced bicondylar fracture of right tibia, initial encounter for closed fracture: Secondary | ICD-10-CM | POA: Diagnosis not present

## 2020-11-17 DIAGNOSIS — S82142A Displaced bicondylar fracture of left tibia, initial encounter for closed fracture: Secondary | ICD-10-CM | POA: Diagnosis not present

## 2020-11-17 DIAGNOSIS — S82141A Displaced bicondylar fracture of right tibia, initial encounter for closed fracture: Secondary | ICD-10-CM | POA: Diagnosis not present

## 2020-11-17 DIAGNOSIS — W010XXA Fall on same level from slipping, tripping and stumbling without subsequent striking against object, initial encounter: Secondary | ICD-10-CM | POA: Diagnosis not present

## 2020-11-18 DIAGNOSIS — W010XXA Fall on same level from slipping, tripping and stumbling without subsequent striking against object, initial encounter: Secondary | ICD-10-CM | POA: Diagnosis not present

## 2020-11-18 DIAGNOSIS — S82141A Displaced bicondylar fracture of right tibia, initial encounter for closed fracture: Secondary | ICD-10-CM | POA: Diagnosis not present

## 2020-11-18 DIAGNOSIS — S82142A Displaced bicondylar fracture of left tibia, initial encounter for closed fracture: Secondary | ICD-10-CM | POA: Diagnosis not present

## 2020-11-19 DIAGNOSIS — W010XXA Fall on same level from slipping, tripping and stumbling without subsequent striking against object, initial encounter: Secondary | ICD-10-CM | POA: Diagnosis not present

## 2020-11-19 DIAGNOSIS — S82141A Displaced bicondylar fracture of right tibia, initial encounter for closed fracture: Secondary | ICD-10-CM | POA: Diagnosis not present

## 2020-11-19 DIAGNOSIS — S82142A Displaced bicondylar fracture of left tibia, initial encounter for closed fracture: Secondary | ICD-10-CM | POA: Diagnosis not present

## 2020-11-20 DIAGNOSIS — M2550 Pain in unspecified joint: Secondary | ICD-10-CM | POA: Diagnosis not present

## 2020-11-20 DIAGNOSIS — S82142D Displaced bicondylar fracture of left tibia, subsequent encounter for closed fracture with routine healing: Secondary | ICD-10-CM | POA: Diagnosis not present

## 2020-11-20 DIAGNOSIS — D509 Iron deficiency anemia, unspecified: Secondary | ICD-10-CM | POA: Diagnosis not present

## 2020-11-20 DIAGNOSIS — M80062D Age-related osteoporosis with current pathological fracture, left lower leg, subsequent encounter for fracture with routine healing: Secondary | ICD-10-CM | POA: Diagnosis not present

## 2020-11-20 DIAGNOSIS — M069 Rheumatoid arthritis, unspecified: Secondary | ICD-10-CM | POA: Diagnosis not present

## 2020-11-20 DIAGNOSIS — Z743 Need for continuous supervision: Secondary | ICD-10-CM | POA: Diagnosis not present

## 2020-11-20 DIAGNOSIS — E611 Iron deficiency: Secondary | ICD-10-CM | POA: Diagnosis not present

## 2020-11-20 DIAGNOSIS — S82141A Displaced bicondylar fracture of right tibia, initial encounter for closed fracture: Secondary | ICD-10-CM | POA: Diagnosis not present

## 2020-11-20 DIAGNOSIS — Z7401 Bed confinement status: Secondary | ICD-10-CM | POA: Diagnosis not present

## 2020-11-20 DIAGNOSIS — M810 Age-related osteoporosis without current pathological fracture: Secondary | ICD-10-CM | POA: Diagnosis not present

## 2020-11-20 DIAGNOSIS — Z741 Need for assistance with personal care: Secondary | ICD-10-CM | POA: Diagnosis not present

## 2020-11-20 DIAGNOSIS — M05742 Rheumatoid arthritis with rheumatoid factor of left hand without organ or systems involvement: Secondary | ICD-10-CM | POA: Diagnosis not present

## 2020-11-20 DIAGNOSIS — D649 Anemia, unspecified: Secondary | ICD-10-CM | POA: Diagnosis not present

## 2020-11-20 DIAGNOSIS — S82142A Displaced bicondylar fracture of left tibia, initial encounter for closed fracture: Secondary | ICD-10-CM | POA: Diagnosis not present

## 2020-11-20 DIAGNOSIS — M80061D Age-related osteoporosis with current pathological fracture, right lower leg, subsequent encounter for fracture with routine healing: Secondary | ICD-10-CM | POA: Diagnosis not present

## 2020-11-20 DIAGNOSIS — M05741 Rheumatoid arthritis with rheumatoid factor of right hand without organ or systems involvement: Secondary | ICD-10-CM | POA: Diagnosis not present

## 2020-11-20 DIAGNOSIS — S82891D Other fracture of right lower leg, subsequent encounter for closed fracture with routine healing: Secondary | ICD-10-CM | POA: Diagnosis not present

## 2020-11-20 DIAGNOSIS — Z79899 Other long term (current) drug therapy: Secondary | ICD-10-CM | POA: Diagnosis not present

## 2020-11-20 DIAGNOSIS — M859 Disorder of bone density and structure, unspecified: Secondary | ICD-10-CM | POA: Diagnosis not present

## 2020-11-20 DIAGNOSIS — W010XXA Fall on same level from slipping, tripping and stumbling without subsequent striking against object, initial encounter: Secondary | ICD-10-CM | POA: Diagnosis not present

## 2020-11-20 DIAGNOSIS — R262 Difficulty in walking, not elsewhere classified: Secondary | ICD-10-CM | POA: Diagnosis not present

## 2020-11-20 DIAGNOSIS — M6281 Muscle weakness (generalized): Secondary | ICD-10-CM | POA: Diagnosis not present

## 2020-11-20 DIAGNOSIS — E785 Hyperlipidemia, unspecified: Secondary | ICD-10-CM | POA: Diagnosis not present

## 2020-11-20 DIAGNOSIS — Z7952 Long term (current) use of systemic steroids: Secondary | ICD-10-CM | POA: Diagnosis not present

## 2020-11-20 DIAGNOSIS — I959 Hypotension, unspecified: Secondary | ICD-10-CM | POA: Diagnosis not present

## 2020-11-20 DIAGNOSIS — R0902 Hypoxemia: Secondary | ICD-10-CM | POA: Diagnosis not present

## 2020-11-20 DIAGNOSIS — Z87891 Personal history of nicotine dependence: Secondary | ICD-10-CM | POA: Diagnosis not present

## 2020-11-20 DIAGNOSIS — R2681 Unsteadiness on feet: Secondary | ICD-10-CM | POA: Diagnosis not present

## 2020-11-20 DIAGNOSIS — E559 Vitamin D deficiency, unspecified: Secondary | ICD-10-CM | POA: Diagnosis not present

## 2020-11-20 DIAGNOSIS — K219 Gastro-esophageal reflux disease without esophagitis: Secondary | ICD-10-CM | POA: Diagnosis not present

## 2020-11-20 DIAGNOSIS — S82141D Displaced bicondylar fracture of right tibia, subsequent encounter for closed fracture with routine healing: Secondary | ICD-10-CM | POA: Diagnosis not present

## 2020-11-22 DIAGNOSIS — M05742 Rheumatoid arthritis with rheumatoid factor of left hand without organ or systems involvement: Secondary | ICD-10-CM | POA: Diagnosis not present

## 2020-11-22 DIAGNOSIS — M05741 Rheumatoid arthritis with rheumatoid factor of right hand without organ or systems involvement: Secondary | ICD-10-CM | POA: Diagnosis not present

## 2020-11-22 DIAGNOSIS — S82142D Displaced bicondylar fracture of left tibia, subsequent encounter for closed fracture with routine healing: Secondary | ICD-10-CM | POA: Diagnosis not present

## 2020-11-22 DIAGNOSIS — E559 Vitamin D deficiency, unspecified: Secondary | ICD-10-CM | POA: Diagnosis not present

## 2020-11-23 DIAGNOSIS — M810 Age-related osteoporosis without current pathological fracture: Secondary | ICD-10-CM | POA: Diagnosis not present

## 2020-11-23 DIAGNOSIS — M05741 Rheumatoid arthritis with rheumatoid factor of right hand without organ or systems involvement: Secondary | ICD-10-CM | POA: Diagnosis not present

## 2020-11-23 DIAGNOSIS — S82142D Displaced bicondylar fracture of left tibia, subsequent encounter for closed fracture with routine healing: Secondary | ICD-10-CM | POA: Diagnosis not present

## 2020-11-23 DIAGNOSIS — D649 Anemia, unspecified: Secondary | ICD-10-CM | POA: Diagnosis not present

## 2020-11-29 DIAGNOSIS — S82142D Displaced bicondylar fracture of left tibia, subsequent encounter for closed fracture with routine healing: Secondary | ICD-10-CM | POA: Diagnosis not present

## 2020-11-29 DIAGNOSIS — M05742 Rheumatoid arthritis with rheumatoid factor of left hand without organ or systems involvement: Secondary | ICD-10-CM | POA: Diagnosis not present

## 2020-11-29 DIAGNOSIS — S82141D Displaced bicondylar fracture of right tibia, subsequent encounter for closed fracture with routine healing: Secondary | ICD-10-CM | POA: Diagnosis not present

## 2020-11-29 DIAGNOSIS — M05741 Rheumatoid arthritis with rheumatoid factor of right hand without organ or systems involvement: Secondary | ICD-10-CM | POA: Diagnosis not present

## 2020-12-06 DIAGNOSIS — M05741 Rheumatoid arthritis with rheumatoid factor of right hand without organ or systems involvement: Secondary | ICD-10-CM | POA: Diagnosis not present

## 2020-12-06 DIAGNOSIS — S82142D Displaced bicondylar fracture of left tibia, subsequent encounter for closed fracture with routine healing: Secondary | ICD-10-CM | POA: Diagnosis not present

## 2020-12-06 DIAGNOSIS — S82141D Displaced bicondylar fracture of right tibia, subsequent encounter for closed fracture with routine healing: Secondary | ICD-10-CM | POA: Diagnosis not present

## 2020-12-06 DIAGNOSIS — D649 Anemia, unspecified: Secondary | ICD-10-CM | POA: Diagnosis not present

## 2020-12-10 DIAGNOSIS — M05742 Rheumatoid arthritis with rheumatoid factor of left hand without organ or systems involvement: Secondary | ICD-10-CM | POA: Diagnosis not present

## 2020-12-10 DIAGNOSIS — R2681 Unsteadiness on feet: Secondary | ICD-10-CM | POA: Diagnosis not present

## 2020-12-10 DIAGNOSIS — M80062D Age-related osteoporosis with current pathological fracture, left lower leg, subsequent encounter for fracture with routine healing: Secondary | ICD-10-CM | POA: Diagnosis not present

## 2020-12-10 DIAGNOSIS — E611 Iron deficiency: Secondary | ICD-10-CM | POA: Diagnosis not present

## 2020-12-10 DIAGNOSIS — Z87891 Personal history of nicotine dependence: Secondary | ICD-10-CM | POA: Diagnosis not present

## 2020-12-10 DIAGNOSIS — M05741 Rheumatoid arthritis with rheumatoid factor of right hand without organ or systems involvement: Secondary | ICD-10-CM | POA: Diagnosis not present

## 2020-12-10 DIAGNOSIS — M80061D Age-related osteoporosis with current pathological fracture, right lower leg, subsequent encounter for fracture with routine healing: Secondary | ICD-10-CM | POA: Diagnosis not present

## 2020-12-10 DIAGNOSIS — S82141D Displaced bicondylar fracture of right tibia, subsequent encounter for closed fracture with routine healing: Secondary | ICD-10-CM | POA: Diagnosis not present

## 2020-12-10 DIAGNOSIS — M069 Rheumatoid arthritis, unspecified: Secondary | ICD-10-CM | POA: Diagnosis not present

## 2020-12-10 DIAGNOSIS — S82891D Other fracture of right lower leg, subsequent encounter for closed fracture with routine healing: Secondary | ICD-10-CM | POA: Diagnosis not present

## 2020-12-10 DIAGNOSIS — D649 Anemia, unspecified: Secondary | ICD-10-CM | POA: Diagnosis not present

## 2020-12-10 DIAGNOSIS — Z7952 Long term (current) use of systemic steroids: Secondary | ICD-10-CM | POA: Diagnosis not present

## 2020-12-10 DIAGNOSIS — K219 Gastro-esophageal reflux disease without esophagitis: Secondary | ICD-10-CM | POA: Diagnosis not present

## 2020-12-11 ENCOUNTER — Other Ambulatory Visit: Payer: Self-pay | Admitting: *Deleted

## 2020-12-11 NOTE — Patient Outreach (Signed)
[facility]) Care Management  12/11/2020  Natalie Osborn 1961/07/30 [id number]   Telephone Assessement/Declined [facility] Primary provider completes Transition of care   RN spoke with pt today concerning [facility] services and her recent discharged from the facility. Pt reports she has a pending appointment with the surgeon in May and currently her Primary care provider is Dr. Arsenio Katz at [place].  RN offered [facility] services for any needed assistance for pharmacy as pt did not wish to review her medications. Pt states [facility] is involved for PT service will be visiting twice weekly until her follow up appointment in May. Pt is currently non-weight bearing to bilateral LE. No other issues as pt declined [facility] services for further inquires or calls with no medical needs at this time for case management services  Case will be closed.  Raina Mina, RN Care Management Coordinator [place] Office [phone]

## 2020-12-13 DIAGNOSIS — Z87891 Personal history of nicotine dependence: Secondary | ICD-10-CM | POA: Diagnosis not present

## 2020-12-13 DIAGNOSIS — Z7952 Long term (current) use of systemic steroids: Secondary | ICD-10-CM | POA: Diagnosis not present

## 2020-12-13 DIAGNOSIS — M80062D Age-related osteoporosis with current pathological fracture, left lower leg, subsequent encounter for fracture with routine healing: Secondary | ICD-10-CM | POA: Diagnosis not present

## 2020-12-13 DIAGNOSIS — M05742 Rheumatoid arthritis with rheumatoid factor of left hand without organ or systems involvement: Secondary | ICD-10-CM | POA: Diagnosis not present

## 2020-12-13 DIAGNOSIS — M80061D Age-related osteoporosis with current pathological fracture, right lower leg, subsequent encounter for fracture with routine healing: Secondary | ICD-10-CM | POA: Diagnosis not present

## 2020-12-13 DIAGNOSIS — M05741 Rheumatoid arthritis with rheumatoid factor of right hand without organ or systems involvement: Secondary | ICD-10-CM | POA: Diagnosis not present

## 2020-12-13 DIAGNOSIS — D649 Anemia, unspecified: Secondary | ICD-10-CM | POA: Diagnosis not present

## 2020-12-13 DIAGNOSIS — K219 Gastro-esophageal reflux disease without esophagitis: Secondary | ICD-10-CM | POA: Diagnosis not present

## 2020-12-13 DIAGNOSIS — E611 Iron deficiency: Secondary | ICD-10-CM | POA: Diagnosis not present

## 2020-12-19 DIAGNOSIS — E611 Iron deficiency: Secondary | ICD-10-CM | POA: Diagnosis not present

## 2020-12-19 DIAGNOSIS — M80061D Age-related osteoporosis with current pathological fracture, right lower leg, subsequent encounter for fracture with routine healing: Secondary | ICD-10-CM | POA: Diagnosis not present

## 2020-12-19 DIAGNOSIS — Z87891 Personal history of nicotine dependence: Secondary | ICD-10-CM | POA: Diagnosis not present

## 2020-12-19 DIAGNOSIS — K219 Gastro-esophageal reflux disease without esophagitis: Secondary | ICD-10-CM | POA: Diagnosis not present

## 2020-12-19 DIAGNOSIS — D649 Anemia, unspecified: Secondary | ICD-10-CM | POA: Diagnosis not present

## 2020-12-19 DIAGNOSIS — M05741 Rheumatoid arthritis with rheumatoid factor of right hand without organ or systems involvement: Secondary | ICD-10-CM | POA: Diagnosis not present

## 2020-12-19 DIAGNOSIS — M05742 Rheumatoid arthritis with rheumatoid factor of left hand without organ or systems involvement: Secondary | ICD-10-CM | POA: Diagnosis not present

## 2020-12-19 DIAGNOSIS — M80062D Age-related osteoporosis with current pathological fracture, left lower leg, subsequent encounter for fracture with routine healing: Secondary | ICD-10-CM | POA: Diagnosis not present

## 2020-12-19 DIAGNOSIS — Z7952 Long term (current) use of systemic steroids: Secondary | ICD-10-CM | POA: Diagnosis not present

## 2020-12-28 DIAGNOSIS — S82122D Displaced fracture of lateral condyle of left tibia, subsequent encounter for closed fracture with routine healing: Secondary | ICD-10-CM | POA: Diagnosis not present

## 2020-12-28 DIAGNOSIS — Z87891 Personal history of nicotine dependence: Secondary | ICD-10-CM | POA: Diagnosis not present

## 2020-12-28 DIAGNOSIS — M05741 Rheumatoid arthritis with rheumatoid factor of right hand without organ or systems involvement: Secondary | ICD-10-CM | POA: Diagnosis not present

## 2020-12-28 DIAGNOSIS — M05742 Rheumatoid arthritis with rheumatoid factor of left hand without organ or systems involvement: Secondary | ICD-10-CM | POA: Diagnosis not present

## 2020-12-28 DIAGNOSIS — K219 Gastro-esophageal reflux disease without esophagitis: Secondary | ICD-10-CM | POA: Diagnosis not present

## 2020-12-28 DIAGNOSIS — S82131D Displaced fracture of medial condyle of right tibia, subsequent encounter for closed fracture with routine healing: Secondary | ICD-10-CM | POA: Diagnosis not present

## 2020-12-28 DIAGNOSIS — M80061D Age-related osteoporosis with current pathological fracture, right lower leg, subsequent encounter for fracture with routine healing: Secondary | ICD-10-CM | POA: Diagnosis not present

## 2020-12-28 DIAGNOSIS — M80062D Age-related osteoporosis with current pathological fracture, left lower leg, subsequent encounter for fracture with routine healing: Secondary | ICD-10-CM | POA: Diagnosis not present

## 2020-12-28 DIAGNOSIS — Z7952 Long term (current) use of systemic steroids: Secondary | ICD-10-CM | POA: Diagnosis not present

## 2020-12-28 DIAGNOSIS — S82143A Displaced bicondylar fracture of unspecified tibia, initial encounter for closed fracture: Secondary | ICD-10-CM | POA: Diagnosis not present

## 2020-12-28 DIAGNOSIS — E611 Iron deficiency: Secondary | ICD-10-CM | POA: Diagnosis not present

## 2020-12-28 DIAGNOSIS — D649 Anemia, unspecified: Secondary | ICD-10-CM | POA: Diagnosis not present

## 2021-01-02 DIAGNOSIS — M80062D Age-related osteoporosis with current pathological fracture, left lower leg, subsequent encounter for fracture with routine healing: Secondary | ICD-10-CM | POA: Diagnosis not present

## 2021-01-02 DIAGNOSIS — D649 Anemia, unspecified: Secondary | ICD-10-CM | POA: Diagnosis not present

## 2021-01-02 DIAGNOSIS — E611 Iron deficiency: Secondary | ICD-10-CM | POA: Diagnosis not present

## 2021-01-02 DIAGNOSIS — K219 Gastro-esophageal reflux disease without esophagitis: Secondary | ICD-10-CM | POA: Diagnosis not present

## 2021-01-02 DIAGNOSIS — Z87891 Personal history of nicotine dependence: Secondary | ICD-10-CM | POA: Diagnosis not present

## 2021-01-02 DIAGNOSIS — M05742 Rheumatoid arthritis with rheumatoid factor of left hand without organ or systems involvement: Secondary | ICD-10-CM | POA: Diagnosis not present

## 2021-01-02 DIAGNOSIS — Z7952 Long term (current) use of systemic steroids: Secondary | ICD-10-CM | POA: Diagnosis not present

## 2021-01-02 DIAGNOSIS — M80061D Age-related osteoporosis with current pathological fracture, right lower leg, subsequent encounter for fracture with routine healing: Secondary | ICD-10-CM | POA: Diagnosis not present

## 2021-01-02 DIAGNOSIS — M05741 Rheumatoid arthritis with rheumatoid factor of right hand without organ or systems involvement: Secondary | ICD-10-CM | POA: Diagnosis not present

## 2021-01-04 DIAGNOSIS — M0589 Other rheumatoid arthritis with rheumatoid factor of multiple sites: Secondary | ICD-10-CM | POA: Diagnosis not present

## 2021-01-09 DIAGNOSIS — S82141D Displaced bicondylar fracture of right tibia, subsequent encounter for closed fracture with routine healing: Secondary | ICD-10-CM | POA: Diagnosis not present

## 2021-01-09 DIAGNOSIS — R2681 Unsteadiness on feet: Secondary | ICD-10-CM | POA: Diagnosis not present

## 2021-01-09 DIAGNOSIS — M069 Rheumatoid arthritis, unspecified: Secondary | ICD-10-CM | POA: Diagnosis not present

## 2021-01-09 DIAGNOSIS — S82891D Other fracture of right lower leg, subsequent encounter for closed fracture with routine healing: Secondary | ICD-10-CM | POA: Diagnosis not present

## 2021-01-15 DIAGNOSIS — Z7952 Long term (current) use of systemic steroids: Secondary | ICD-10-CM | POA: Diagnosis not present

## 2021-01-15 DIAGNOSIS — M80062D Age-related osteoporosis with current pathological fracture, left lower leg, subsequent encounter for fracture with routine healing: Secondary | ICD-10-CM | POA: Diagnosis not present

## 2021-01-15 DIAGNOSIS — M05742 Rheumatoid arthritis with rheumatoid factor of left hand without organ or systems involvement: Secondary | ICD-10-CM | POA: Diagnosis not present

## 2021-01-15 DIAGNOSIS — M05741 Rheumatoid arthritis with rheumatoid factor of right hand without organ or systems involvement: Secondary | ICD-10-CM | POA: Diagnosis not present

## 2021-01-15 DIAGNOSIS — K219 Gastro-esophageal reflux disease without esophagitis: Secondary | ICD-10-CM | POA: Diagnosis not present

## 2021-01-15 DIAGNOSIS — D649 Anemia, unspecified: Secondary | ICD-10-CM | POA: Diagnosis not present

## 2021-01-15 DIAGNOSIS — Z87891 Personal history of nicotine dependence: Secondary | ICD-10-CM | POA: Diagnosis not present

## 2021-01-15 DIAGNOSIS — E611 Iron deficiency: Secondary | ICD-10-CM | POA: Diagnosis not present

## 2021-01-15 DIAGNOSIS — M80061D Age-related osteoporosis with current pathological fracture, right lower leg, subsequent encounter for fracture with routine healing: Secondary | ICD-10-CM | POA: Diagnosis not present

## 2021-01-17 DIAGNOSIS — M80062D Age-related osteoporosis with current pathological fracture, left lower leg, subsequent encounter for fracture with routine healing: Secondary | ICD-10-CM | POA: Diagnosis not present

## 2021-01-17 DIAGNOSIS — Z87891 Personal history of nicotine dependence: Secondary | ICD-10-CM | POA: Diagnosis not present

## 2021-01-17 DIAGNOSIS — M05741 Rheumatoid arthritis with rheumatoid factor of right hand without organ or systems involvement: Secondary | ICD-10-CM | POA: Diagnosis not present

## 2021-01-17 DIAGNOSIS — D649 Anemia, unspecified: Secondary | ICD-10-CM | POA: Diagnosis not present

## 2021-01-17 DIAGNOSIS — M80061D Age-related osteoporosis with current pathological fracture, right lower leg, subsequent encounter for fracture with routine healing: Secondary | ICD-10-CM | POA: Diagnosis not present

## 2021-01-17 DIAGNOSIS — E611 Iron deficiency: Secondary | ICD-10-CM | POA: Diagnosis not present

## 2021-01-17 DIAGNOSIS — Z7952 Long term (current) use of systemic steroids: Secondary | ICD-10-CM | POA: Diagnosis not present

## 2021-01-17 DIAGNOSIS — M05742 Rheumatoid arthritis with rheumatoid factor of left hand without organ or systems involvement: Secondary | ICD-10-CM | POA: Diagnosis not present

## 2021-01-17 DIAGNOSIS — K219 Gastro-esophageal reflux disease without esophagitis: Secondary | ICD-10-CM | POA: Diagnosis not present

## 2021-01-22 DIAGNOSIS — Z87891 Personal history of nicotine dependence: Secondary | ICD-10-CM | POA: Diagnosis not present

## 2021-01-22 DIAGNOSIS — E611 Iron deficiency: Secondary | ICD-10-CM | POA: Diagnosis not present

## 2021-01-22 DIAGNOSIS — M80061D Age-related osteoporosis with current pathological fracture, right lower leg, subsequent encounter for fracture with routine healing: Secondary | ICD-10-CM | POA: Diagnosis not present

## 2021-01-22 DIAGNOSIS — M05741 Rheumatoid arthritis with rheumatoid factor of right hand without organ or systems involvement: Secondary | ICD-10-CM | POA: Diagnosis not present

## 2021-01-22 DIAGNOSIS — K219 Gastro-esophageal reflux disease without esophagitis: Secondary | ICD-10-CM | POA: Diagnosis not present

## 2021-01-22 DIAGNOSIS — D649 Anemia, unspecified: Secondary | ICD-10-CM | POA: Diagnosis not present

## 2021-01-22 DIAGNOSIS — Z7952 Long term (current) use of systemic steroids: Secondary | ICD-10-CM | POA: Diagnosis not present

## 2021-01-22 DIAGNOSIS — M05742 Rheumatoid arthritis with rheumatoid factor of left hand without organ or systems involvement: Secondary | ICD-10-CM | POA: Diagnosis not present

## 2021-01-22 DIAGNOSIS — M80062D Age-related osteoporosis with current pathological fracture, left lower leg, subsequent encounter for fracture with routine healing: Secondary | ICD-10-CM | POA: Diagnosis not present

## 2021-01-24 DIAGNOSIS — M05742 Rheumatoid arthritis with rheumatoid factor of left hand without organ or systems involvement: Secondary | ICD-10-CM | POA: Diagnosis not present

## 2021-01-24 DIAGNOSIS — D649 Anemia, unspecified: Secondary | ICD-10-CM | POA: Diagnosis not present

## 2021-01-24 DIAGNOSIS — Z87891 Personal history of nicotine dependence: Secondary | ICD-10-CM | POA: Diagnosis not present

## 2021-01-24 DIAGNOSIS — M05741 Rheumatoid arthritis with rheumatoid factor of right hand without organ or systems involvement: Secondary | ICD-10-CM | POA: Diagnosis not present

## 2021-01-24 DIAGNOSIS — M80061D Age-related osteoporosis with current pathological fracture, right lower leg, subsequent encounter for fracture with routine healing: Secondary | ICD-10-CM | POA: Diagnosis not present

## 2021-01-24 DIAGNOSIS — E611 Iron deficiency: Secondary | ICD-10-CM | POA: Diagnosis not present

## 2021-01-24 DIAGNOSIS — Z7952 Long term (current) use of systemic steroids: Secondary | ICD-10-CM | POA: Diagnosis not present

## 2021-01-24 DIAGNOSIS — M80062D Age-related osteoporosis with current pathological fracture, left lower leg, subsequent encounter for fracture with routine healing: Secondary | ICD-10-CM | POA: Diagnosis not present

## 2021-01-24 DIAGNOSIS — K219 Gastro-esophageal reflux disease without esophagitis: Secondary | ICD-10-CM | POA: Diagnosis not present

## 2021-01-29 DIAGNOSIS — D649 Anemia, unspecified: Secondary | ICD-10-CM | POA: Diagnosis not present

## 2021-01-29 DIAGNOSIS — M80061D Age-related osteoporosis with current pathological fracture, right lower leg, subsequent encounter for fracture with routine healing: Secondary | ICD-10-CM | POA: Diagnosis not present

## 2021-01-29 DIAGNOSIS — M80062D Age-related osteoporosis with current pathological fracture, left lower leg, subsequent encounter for fracture with routine healing: Secondary | ICD-10-CM | POA: Diagnosis not present

## 2021-01-29 DIAGNOSIS — Z87891 Personal history of nicotine dependence: Secondary | ICD-10-CM | POA: Diagnosis not present

## 2021-01-29 DIAGNOSIS — M05742 Rheumatoid arthritis with rheumatoid factor of left hand without organ or systems involvement: Secondary | ICD-10-CM | POA: Diagnosis not present

## 2021-01-29 DIAGNOSIS — E611 Iron deficiency: Secondary | ICD-10-CM | POA: Diagnosis not present

## 2021-01-29 DIAGNOSIS — M05741 Rheumatoid arthritis with rheumatoid factor of right hand without organ or systems involvement: Secondary | ICD-10-CM | POA: Diagnosis not present

## 2021-01-29 DIAGNOSIS — K219 Gastro-esophageal reflux disease without esophagitis: Secondary | ICD-10-CM | POA: Diagnosis not present

## 2021-01-29 DIAGNOSIS — Z7952 Long term (current) use of systemic steroids: Secondary | ICD-10-CM | POA: Diagnosis not present

## 2021-02-04 DIAGNOSIS — M0589 Other rheumatoid arthritis with rheumatoid factor of multiple sites: Secondary | ICD-10-CM | POA: Diagnosis not present

## 2021-02-05 DIAGNOSIS — M79643 Pain in unspecified hand: Secondary | ICD-10-CM | POA: Diagnosis not present

## 2021-02-05 DIAGNOSIS — M1990 Unspecified osteoarthritis, unspecified site: Secondary | ICD-10-CM | POA: Diagnosis not present

## 2021-02-05 DIAGNOSIS — M25559 Pain in unspecified hip: Secondary | ICD-10-CM | POA: Diagnosis not present

## 2021-02-05 DIAGNOSIS — M810 Age-related osteoporosis without current pathological fracture: Secondary | ICD-10-CM | POA: Diagnosis not present

## 2021-02-05 DIAGNOSIS — M0589 Other rheumatoid arthritis with rheumatoid factor of multiple sites: Secondary | ICD-10-CM | POA: Diagnosis not present

## 2021-02-05 DIAGNOSIS — Z79899 Other long term (current) drug therapy: Secondary | ICD-10-CM | POA: Diagnosis not present

## 2021-02-09 DIAGNOSIS — M069 Rheumatoid arthritis, unspecified: Secondary | ICD-10-CM | POA: Diagnosis not present

## 2021-02-09 DIAGNOSIS — S82141D Displaced bicondylar fracture of right tibia, subsequent encounter for closed fracture with routine healing: Secondary | ICD-10-CM | POA: Diagnosis not present

## 2021-02-09 DIAGNOSIS — S82891D Other fracture of right lower leg, subsequent encounter for closed fracture with routine healing: Secondary | ICD-10-CM | POA: Diagnosis not present

## 2021-02-09 DIAGNOSIS — R2681 Unsteadiness on feet: Secondary | ICD-10-CM | POA: Diagnosis not present

## 2021-03-04 DIAGNOSIS — M0589 Other rheumatoid arthritis with rheumatoid factor of multiple sites: Secondary | ICD-10-CM | POA: Diagnosis not present

## 2021-04-08 DIAGNOSIS — M0589 Other rheumatoid arthritis with rheumatoid factor of multiple sites: Secondary | ICD-10-CM | POA: Diagnosis not present

## 2021-05-06 DIAGNOSIS — M0589 Other rheumatoid arthritis with rheumatoid factor of multiple sites: Secondary | ICD-10-CM | POA: Diagnosis not present

## 2021-05-07 DIAGNOSIS — N3091 Cystitis, unspecified with hematuria: Secondary | ICD-10-CM | POA: Diagnosis not present

## 2021-06-03 DIAGNOSIS — M0589 Other rheumatoid arthritis with rheumatoid factor of multiple sites: Secondary | ICD-10-CM | POA: Diagnosis not present

## 2021-06-04 DIAGNOSIS — M0589 Other rheumatoid arthritis with rheumatoid factor of multiple sites: Secondary | ICD-10-CM | POA: Diagnosis not present

## 2021-06-04 DIAGNOSIS — M1990 Unspecified osteoarthritis, unspecified site: Secondary | ICD-10-CM | POA: Diagnosis not present

## 2021-06-04 DIAGNOSIS — Z79899 Other long term (current) drug therapy: Secondary | ICD-10-CM | POA: Diagnosis not present

## 2021-06-04 DIAGNOSIS — M810 Age-related osteoporosis without current pathological fracture: Secondary | ICD-10-CM | POA: Diagnosis not present

## 2021-06-04 DIAGNOSIS — M79643 Pain in unspecified hand: Secondary | ICD-10-CM | POA: Diagnosis not present

## 2021-06-04 DIAGNOSIS — M25559 Pain in unspecified hip: Secondary | ICD-10-CM | POA: Diagnosis not present

## 2021-07-01 DIAGNOSIS — M0589 Other rheumatoid arthritis with rheumatoid factor of multiple sites: Secondary | ICD-10-CM | POA: Diagnosis not present

## 2021-07-09 DIAGNOSIS — I959 Hypotension, unspecified: Secondary | ICD-10-CM | POA: Diagnosis not present

## 2021-07-09 DIAGNOSIS — K529 Noninfective gastroenteritis and colitis, unspecified: Secondary | ICD-10-CM | POA: Diagnosis not present

## 2021-07-09 DIAGNOSIS — K760 Fatty (change of) liver, not elsewhere classified: Secondary | ICD-10-CM | POA: Diagnosis not present

## 2021-07-09 DIAGNOSIS — R509 Fever, unspecified: Secondary | ICD-10-CM | POA: Diagnosis not present

## 2021-07-09 DIAGNOSIS — E876 Hypokalemia: Secondary | ICD-10-CM | POA: Diagnosis not present

## 2021-07-09 DIAGNOSIS — R4182 Altered mental status, unspecified: Secondary | ICD-10-CM | POA: Diagnosis not present

## 2021-07-09 DIAGNOSIS — N390 Urinary tract infection, site not specified: Secondary | ICD-10-CM | POA: Diagnosis not present

## 2021-07-09 DIAGNOSIS — R42 Dizziness and giddiness: Secondary | ICD-10-CM | POA: Diagnosis not present

## 2021-07-09 DIAGNOSIS — D84821 Immunodeficiency due to drugs: Secondary | ICD-10-CM | POA: Diagnosis not present

## 2021-07-09 DIAGNOSIS — S199XXA Unspecified injury of neck, initial encounter: Secondary | ICD-10-CM | POA: Diagnosis not present

## 2021-07-09 DIAGNOSIS — G9389 Other specified disorders of brain: Secondary | ICD-10-CM | POA: Diagnosis not present

## 2021-07-09 DIAGNOSIS — R4689 Other symptoms and signs involving appearance and behavior: Secondary | ICD-10-CM | POA: Diagnosis not present

## 2021-07-09 DIAGNOSIS — A021 Salmonella sepsis: Secondary | ICD-10-CM | POA: Diagnosis not present

## 2021-07-09 DIAGNOSIS — R000 Tachycardia, unspecified: Secondary | ICD-10-CM | POA: Diagnosis not present

## 2021-07-09 DIAGNOSIS — G9341 Metabolic encephalopathy: Secondary | ICD-10-CM | POA: Diagnosis not present

## 2021-07-09 DIAGNOSIS — R0902 Hypoxemia: Secondary | ICD-10-CM | POA: Diagnosis not present

## 2021-07-09 DIAGNOSIS — K219 Gastro-esophageal reflux disease without esophagitis: Secondary | ICD-10-CM | POA: Diagnosis not present

## 2021-07-09 DIAGNOSIS — A419 Sepsis, unspecified organism: Secondary | ICD-10-CM | POA: Diagnosis not present

## 2021-07-09 DIAGNOSIS — J9601 Acute respiratory failure with hypoxia: Secondary | ICD-10-CM | POA: Diagnosis not present

## 2021-07-09 DIAGNOSIS — M47812 Spondylosis without myelopathy or radiculopathy, cervical region: Secondary | ICD-10-CM | POA: Diagnosis not present

## 2021-07-09 DIAGNOSIS — M810 Age-related osteoporosis without current pathological fracture: Secondary | ICD-10-CM | POA: Diagnosis not present

## 2021-07-09 DIAGNOSIS — R911 Solitary pulmonary nodule: Secondary | ICD-10-CM | POA: Diagnosis not present

## 2021-07-09 DIAGNOSIS — A020 Salmonella enteritis: Secondary | ICD-10-CM | POA: Diagnosis not present

## 2021-07-10 DIAGNOSIS — J9601 Acute respiratory failure with hypoxia: Secondary | ICD-10-CM | POA: Diagnosis not present

## 2021-07-10 DIAGNOSIS — A021 Salmonella sepsis: Secondary | ICD-10-CM | POA: Diagnosis not present

## 2021-07-10 DIAGNOSIS — G9341 Metabolic encephalopathy: Secondary | ICD-10-CM | POA: Diagnosis not present

## 2021-07-11 DIAGNOSIS — A021 Salmonella sepsis: Secondary | ICD-10-CM | POA: Diagnosis not present

## 2021-07-11 DIAGNOSIS — J9601 Acute respiratory failure with hypoxia: Secondary | ICD-10-CM | POA: Diagnosis not present

## 2021-07-11 DIAGNOSIS — G9341 Metabolic encephalopathy: Secondary | ICD-10-CM | POA: Diagnosis not present

## 2021-07-12 DIAGNOSIS — J9601 Acute respiratory failure with hypoxia: Secondary | ICD-10-CM | POA: Diagnosis not present

## 2021-07-12 DIAGNOSIS — A021 Salmonella sepsis: Secondary | ICD-10-CM | POA: Diagnosis not present

## 2021-07-12 DIAGNOSIS — G9341 Metabolic encephalopathy: Secondary | ICD-10-CM | POA: Diagnosis not present

## 2021-07-13 DIAGNOSIS — A021 Salmonella sepsis: Secondary | ICD-10-CM | POA: Diagnosis not present

## 2021-07-13 DIAGNOSIS — J9601 Acute respiratory failure with hypoxia: Secondary | ICD-10-CM | POA: Diagnosis not present

## 2021-07-13 DIAGNOSIS — G9341 Metabolic encephalopathy: Secondary | ICD-10-CM | POA: Diagnosis not present

## 2021-07-22 DIAGNOSIS — Z6830 Body mass index (BMI) 30.0-30.9, adult: Secondary | ICD-10-CM | POA: Diagnosis not present

## 2021-07-22 DIAGNOSIS — Z2821 Immunization not carried out because of patient refusal: Secondary | ICD-10-CM | POA: Diagnosis not present

## 2021-07-22 DIAGNOSIS — Z8619 Personal history of other infectious and parasitic diseases: Secondary | ICD-10-CM | POA: Diagnosis not present

## 2021-07-22 DIAGNOSIS — Z1331 Encounter for screening for depression: Secondary | ICD-10-CM | POA: Diagnosis not present

## 2021-07-25 DIAGNOSIS — M0589 Other rheumatoid arthritis with rheumatoid factor of multiple sites: Secondary | ICD-10-CM | POA: Diagnosis not present

## 2021-07-25 DIAGNOSIS — Z1159 Encounter for screening for other viral diseases: Secondary | ICD-10-CM | POA: Diagnosis not present

## 2021-07-25 DIAGNOSIS — Z79899 Other long term (current) drug therapy: Secondary | ICD-10-CM | POA: Diagnosis not present

## 2021-07-29 DIAGNOSIS — M0589 Other rheumatoid arthritis with rheumatoid factor of multiple sites: Secondary | ICD-10-CM | POA: Diagnosis not present

## 2021-07-30 DIAGNOSIS — H5203 Hypermetropia, bilateral: Secondary | ICD-10-CM | POA: Diagnosis not present

## 2021-07-30 DIAGNOSIS — H52223 Regular astigmatism, bilateral: Secondary | ICD-10-CM | POA: Diagnosis not present

## 2021-07-30 DIAGNOSIS — H524 Presbyopia: Secondary | ICD-10-CM | POA: Diagnosis not present

## 2021-07-30 DIAGNOSIS — Z79899 Other long term (current) drug therapy: Secondary | ICD-10-CM | POA: Diagnosis not present

## 2021-07-31 DIAGNOSIS — H52209 Unspecified astigmatism, unspecified eye: Secondary | ICD-10-CM | POA: Diagnosis not present

## 2021-07-31 DIAGNOSIS — H5203 Hypermetropia, bilateral: Secondary | ICD-10-CM | POA: Diagnosis not present

## 2021-07-31 DIAGNOSIS — H524 Presbyopia: Secondary | ICD-10-CM | POA: Diagnosis not present

## 2021-09-05 DIAGNOSIS — Z20828 Contact with and (suspected) exposure to other viral communicable diseases: Secondary | ICD-10-CM | POA: Diagnosis not present

## 2021-09-05 DIAGNOSIS — J069 Acute upper respiratory infection, unspecified: Secondary | ICD-10-CM | POA: Diagnosis not present

## 2021-10-08 DIAGNOSIS — M79643 Pain in unspecified hand: Secondary | ICD-10-CM | POA: Diagnosis not present

## 2021-10-08 DIAGNOSIS — M810 Age-related osteoporosis without current pathological fracture: Secondary | ICD-10-CM | POA: Diagnosis not present

## 2021-10-08 DIAGNOSIS — M79671 Pain in right foot: Secondary | ICD-10-CM | POA: Diagnosis not present

## 2021-10-08 DIAGNOSIS — M0589 Other rheumatoid arthritis with rheumatoid factor of multiple sites: Secondary | ICD-10-CM | POA: Diagnosis not present

## 2021-10-08 DIAGNOSIS — M1990 Unspecified osteoarthritis, unspecified site: Secondary | ICD-10-CM | POA: Diagnosis not present

## 2021-10-08 DIAGNOSIS — Z79899 Other long term (current) drug therapy: Secondary | ICD-10-CM | POA: Diagnosis not present

## 2021-11-15 DIAGNOSIS — M0589 Other rheumatoid arthritis with rheumatoid factor of multiple sites: Secondary | ICD-10-CM | POA: Diagnosis not present

## 2021-12-09 DIAGNOSIS — M1990 Unspecified osteoarthritis, unspecified site: Secondary | ICD-10-CM | POA: Diagnosis not present

## 2021-12-09 DIAGNOSIS — Z79899 Other long term (current) drug therapy: Secondary | ICD-10-CM | POA: Diagnosis not present

## 2021-12-09 DIAGNOSIS — M0589 Other rheumatoid arthritis with rheumatoid factor of multiple sites: Secondary | ICD-10-CM | POA: Diagnosis not present

## 2021-12-09 DIAGNOSIS — M79643 Pain in unspecified hand: Secondary | ICD-10-CM | POA: Diagnosis not present

## 2021-12-09 DIAGNOSIS — M810 Age-related osteoporosis without current pathological fracture: Secondary | ICD-10-CM | POA: Diagnosis not present

## 2021-12-13 DIAGNOSIS — M0589 Other rheumatoid arthritis with rheumatoid factor of multiple sites: Secondary | ICD-10-CM | POA: Diagnosis not present

## 2022-01-10 DIAGNOSIS — M0589 Other rheumatoid arthritis with rheumatoid factor of multiple sites: Secondary | ICD-10-CM | POA: Diagnosis not present

## 2022-02-07 DIAGNOSIS — M0589 Other rheumatoid arthritis with rheumatoid factor of multiple sites: Secondary | ICD-10-CM | POA: Diagnosis not present

## 2022-03-10 DIAGNOSIS — M0589 Other rheumatoid arthritis with rheumatoid factor of multiple sites: Secondary | ICD-10-CM | POA: Diagnosis not present

## 2022-03-13 DIAGNOSIS — H9201 Otalgia, right ear: Secondary | ICD-10-CM | POA: Diagnosis not present

## 2022-03-13 DIAGNOSIS — Z6830 Body mass index (BMI) 30.0-30.9, adult: Secondary | ICD-10-CM | POA: Diagnosis not present

## 2022-04-07 DIAGNOSIS — M0589 Other rheumatoid arthritis with rheumatoid factor of multiple sites: Secondary | ICD-10-CM | POA: Diagnosis not present

## 2022-04-10 DIAGNOSIS — M1990 Unspecified osteoarthritis, unspecified site: Secondary | ICD-10-CM | POA: Diagnosis not present

## 2022-04-10 DIAGNOSIS — M25571 Pain in right ankle and joints of right foot: Secondary | ICD-10-CM | POA: Diagnosis not present

## 2022-04-10 DIAGNOSIS — M810 Age-related osteoporosis without current pathological fracture: Secondary | ICD-10-CM | POA: Diagnosis not present

## 2022-04-10 DIAGNOSIS — M79671 Pain in right foot: Secondary | ICD-10-CM | POA: Diagnosis not present

## 2022-04-10 DIAGNOSIS — M0589 Other rheumatoid arthritis with rheumatoid factor of multiple sites: Secondary | ICD-10-CM | POA: Diagnosis not present

## 2022-04-10 DIAGNOSIS — M25572 Pain in left ankle and joints of left foot: Secondary | ICD-10-CM | POA: Diagnosis not present

## 2022-04-10 DIAGNOSIS — M79672 Pain in left foot: Secondary | ICD-10-CM | POA: Diagnosis not present

## 2022-04-10 DIAGNOSIS — Z79899 Other long term (current) drug therapy: Secondary | ICD-10-CM | POA: Diagnosis not present

## 2022-04-10 DIAGNOSIS — M79643 Pain in unspecified hand: Secondary | ICD-10-CM | POA: Diagnosis not present

## 2022-04-15 DIAGNOSIS — M79671 Pain in right foot: Secondary | ICD-10-CM | POA: Diagnosis not present

## 2022-04-15 DIAGNOSIS — M25572 Pain in left ankle and joints of left foot: Secondary | ICD-10-CM | POA: Diagnosis not present

## 2022-05-05 DIAGNOSIS — M0589 Other rheumatoid arthritis with rheumatoid factor of multiple sites: Secondary | ICD-10-CM | POA: Diagnosis not present

## 2022-06-02 DIAGNOSIS — M0589 Other rheumatoid arthritis with rheumatoid factor of multiple sites: Secondary | ICD-10-CM | POA: Diagnosis not present

## 2022-06-30 DIAGNOSIS — M0589 Other rheumatoid arthritis with rheumatoid factor of multiple sites: Secondary | ICD-10-CM | POA: Diagnosis not present

## 2022-07-28 DIAGNOSIS — Z1159 Encounter for screening for other viral diseases: Secondary | ICD-10-CM | POA: Diagnosis not present

## 2022-07-28 DIAGNOSIS — M0589 Other rheumatoid arthritis with rheumatoid factor of multiple sites: Secondary | ICD-10-CM | POA: Diagnosis not present

## 2022-07-31 DIAGNOSIS — Z79899 Other long term (current) drug therapy: Secondary | ICD-10-CM | POA: Diagnosis not present

## 2022-07-31 DIAGNOSIS — H52223 Regular astigmatism, bilateral: Secondary | ICD-10-CM | POA: Diagnosis not present

## 2022-07-31 DIAGNOSIS — H5203 Hypermetropia, bilateral: Secondary | ICD-10-CM | POA: Diagnosis not present

## 2022-08-05 DIAGNOSIS — Z79899 Other long term (current) drug therapy: Secondary | ICD-10-CM | POA: Diagnosis not present

## 2022-08-05 DIAGNOSIS — M25572 Pain in left ankle and joints of left foot: Secondary | ICD-10-CM | POA: Diagnosis not present

## 2022-08-05 DIAGNOSIS — M79671 Pain in right foot: Secondary | ICD-10-CM | POA: Diagnosis not present

## 2022-08-05 DIAGNOSIS — M79643 Pain in unspecified hand: Secondary | ICD-10-CM | POA: Diagnosis not present

## 2022-08-05 DIAGNOSIS — M1990 Unspecified osteoarthritis, unspecified site: Secondary | ICD-10-CM | POA: Diagnosis not present

## 2022-08-05 DIAGNOSIS — M810 Age-related osteoporosis without current pathological fracture: Secondary | ICD-10-CM | POA: Diagnosis not present

## 2022-08-05 DIAGNOSIS — M0589 Other rheumatoid arthritis with rheumatoid factor of multiple sites: Secondary | ICD-10-CM | POA: Diagnosis not present

## 2022-08-07 DIAGNOSIS — Z0100 Encounter for examination of eyes and vision without abnormal findings: Secondary | ICD-10-CM | POA: Diagnosis not present

## 2022-08-27 DIAGNOSIS — M069 Rheumatoid arthritis, unspecified: Secondary | ICD-10-CM | POA: Diagnosis not present

## 2022-08-27 DIAGNOSIS — Z6830 Body mass index (BMI) 30.0-30.9, adult: Secondary | ICD-10-CM | POA: Diagnosis not present

## 2022-08-27 DIAGNOSIS — R112 Nausea with vomiting, unspecified: Secondary | ICD-10-CM | POA: Diagnosis not present

## 2022-08-27 DIAGNOSIS — M545 Low back pain, unspecified: Secondary | ICD-10-CM | POA: Diagnosis not present

## 2022-09-09 DIAGNOSIS — M545 Low back pain, unspecified: Secondary | ICD-10-CM | POA: Diagnosis not present

## 2022-09-09 DIAGNOSIS — M48061 Spinal stenosis, lumbar region without neurogenic claudication: Secondary | ICD-10-CM | POA: Diagnosis not present

## 2022-09-09 DIAGNOSIS — M4807 Spinal stenosis, lumbosacral region: Secondary | ICD-10-CM | POA: Diagnosis not present

## 2022-09-09 DIAGNOSIS — M47816 Spondylosis without myelopathy or radiculopathy, lumbar region: Secondary | ICD-10-CM | POA: Diagnosis not present

## 2022-09-09 DIAGNOSIS — K760 Fatty (change of) liver, not elsewhere classified: Secondary | ICD-10-CM | POA: Diagnosis not present

## 2022-09-09 DIAGNOSIS — I700 Atherosclerosis of aorta: Secondary | ICD-10-CM | POA: Diagnosis not present

## 2022-10-07 DIAGNOSIS — D84821 Immunodeficiency due to drugs: Secondary | ICD-10-CM | POA: Diagnosis not present

## 2022-10-07 DIAGNOSIS — M069 Rheumatoid arthritis, unspecified: Secondary | ICD-10-CM | POA: Diagnosis not present

## 2022-10-07 DIAGNOSIS — E669 Obesity, unspecified: Secondary | ICD-10-CM | POA: Diagnosis not present

## 2022-10-07 DIAGNOSIS — I10 Essential (primary) hypertension: Secondary | ICD-10-CM | POA: Diagnosis not present

## 2022-10-07 DIAGNOSIS — Z87891 Personal history of nicotine dependence: Secondary | ICD-10-CM | POA: Diagnosis not present

## 2022-10-07 DIAGNOSIS — Z008 Encounter for other general examination: Secondary | ICD-10-CM | POA: Diagnosis not present

## 2022-10-07 DIAGNOSIS — K219 Gastro-esophageal reflux disease without esophagitis: Secondary | ICD-10-CM | POA: Diagnosis not present

## 2022-10-07 DIAGNOSIS — M1990 Unspecified osteoarthritis, unspecified site: Secondary | ICD-10-CM | POA: Diagnosis not present

## 2022-10-07 DIAGNOSIS — M810 Age-related osteoporosis without current pathological fracture: Secondary | ICD-10-CM | POA: Diagnosis not present

## 2022-10-07 DIAGNOSIS — M5440 Lumbago with sciatica, unspecified side: Secondary | ICD-10-CM | POA: Diagnosis not present

## 2022-10-07 DIAGNOSIS — Z8249 Family history of ischemic heart disease and other diseases of the circulatory system: Secondary | ICD-10-CM | POA: Diagnosis not present

## 2022-10-07 DIAGNOSIS — M4800 Spinal stenosis, site unspecified: Secondary | ICD-10-CM | POA: Diagnosis not present

## 2022-10-07 DIAGNOSIS — K5900 Constipation, unspecified: Secondary | ICD-10-CM | POA: Diagnosis not present

## 2022-11-25 DIAGNOSIS — M79671 Pain in right foot: Secondary | ICD-10-CM | POA: Diagnosis not present

## 2022-11-25 DIAGNOSIS — Z79899 Other long term (current) drug therapy: Secondary | ICD-10-CM | POA: Diagnosis not present

## 2022-11-25 DIAGNOSIS — M1990 Unspecified osteoarthritis, unspecified site: Secondary | ICD-10-CM | POA: Diagnosis not present

## 2022-11-25 DIAGNOSIS — M810 Age-related osteoporosis without current pathological fracture: Secondary | ICD-10-CM | POA: Diagnosis not present

## 2022-11-25 DIAGNOSIS — M79643 Pain in unspecified hand: Secondary | ICD-10-CM | POA: Diagnosis not present

## 2022-11-25 DIAGNOSIS — M0589 Other rheumatoid arthritis with rheumatoid factor of multiple sites: Secondary | ICD-10-CM | POA: Diagnosis not present

## 2022-11-25 DIAGNOSIS — M25572 Pain in left ankle and joints of left foot: Secondary | ICD-10-CM | POA: Diagnosis not present

## 2022-12-05 DIAGNOSIS — J329 Chronic sinusitis, unspecified: Secondary | ICD-10-CM | POA: Diagnosis not present

## 2022-12-05 DIAGNOSIS — J302 Other seasonal allergic rhinitis: Secondary | ICD-10-CM | POA: Diagnosis not present

## 2022-12-05 DIAGNOSIS — J40 Bronchitis, not specified as acute or chronic: Secondary | ICD-10-CM | POA: Diagnosis not present

## 2022-12-23 DIAGNOSIS — M0589 Other rheumatoid arthritis with rheumatoid factor of multiple sites: Secondary | ICD-10-CM | POA: Diagnosis not present

## 2023-01-20 DIAGNOSIS — M0589 Other rheumatoid arthritis with rheumatoid factor of multiple sites: Secondary | ICD-10-CM | POA: Diagnosis not present

## 2023-02-24 DIAGNOSIS — M0589 Other rheumatoid arthritis with rheumatoid factor of multiple sites: Secondary | ICD-10-CM | POA: Diagnosis not present

## 2023-02-24 DIAGNOSIS — M79643 Pain in unspecified hand: Secondary | ICD-10-CM | POA: Diagnosis not present

## 2023-02-24 DIAGNOSIS — M810 Age-related osteoporosis without current pathological fracture: Secondary | ICD-10-CM | POA: Diagnosis not present

## 2023-02-24 DIAGNOSIS — M79671 Pain in right foot: Secondary | ICD-10-CM | POA: Diagnosis not present

## 2023-02-24 DIAGNOSIS — Z79899 Other long term (current) drug therapy: Secondary | ICD-10-CM | POA: Diagnosis not present

## 2023-02-24 DIAGNOSIS — M25572 Pain in left ankle and joints of left foot: Secondary | ICD-10-CM | POA: Diagnosis not present

## 2023-02-24 DIAGNOSIS — M1990 Unspecified osteoarthritis, unspecified site: Secondary | ICD-10-CM | POA: Diagnosis not present

## 2023-03-24 DIAGNOSIS — M0589 Other rheumatoid arthritis with rheumatoid factor of multiple sites: Secondary | ICD-10-CM | POA: Diagnosis not present

## 2023-04-21 DIAGNOSIS — M0589 Other rheumatoid arthritis with rheumatoid factor of multiple sites: Secondary | ICD-10-CM | POA: Diagnosis not present

## 2023-06-16 DIAGNOSIS — M0589 Other rheumatoid arthritis with rheumatoid factor of multiple sites: Secondary | ICD-10-CM | POA: Diagnosis not present

## 2023-08-20 ENCOUNTER — Ambulatory Visit (HOSPITAL_BASED_OUTPATIENT_CLINIC_OR_DEPARTMENT_OTHER)
Admission: EM | Admit: 2023-08-20 | Discharge: 2023-08-20 | Disposition: A | Discharge status: Home / Self Care | Payer: Medicare HMO | Attending: Internal Medicine | Admitting: Internal Medicine

## 2023-08-20 ENCOUNTER — Encounter (HOSPITAL_BASED_OUTPATIENT_CLINIC_OR_DEPARTMENT_OTHER): Payer: Self-pay

## 2023-08-20 DIAGNOSIS — Z79631 Long term (current) use of antimetabolite agent: Secondary | ICD-10-CM | POA: Insufficient documentation

## 2023-08-20 DIAGNOSIS — Z9189 Other specified personal risk factors, not elsewhere classified: Secondary | ICD-10-CM | POA: Diagnosis not present

## 2023-08-20 DIAGNOSIS — M069 Rheumatoid arthritis, unspecified: Secondary | ICD-10-CM | POA: Diagnosis not present

## 2023-08-20 DIAGNOSIS — Z87891 Personal history of nicotine dependence: Secondary | ICD-10-CM | POA: Insufficient documentation

## 2023-08-20 DIAGNOSIS — J069 Acute upper respiratory infection, unspecified: Secondary | ICD-10-CM | POA: Insufficient documentation

## 2023-08-20 DIAGNOSIS — R0982 Postnasal drip: Secondary | ICD-10-CM | POA: Diagnosis not present

## 2023-08-20 DIAGNOSIS — R519 Headache, unspecified: Secondary | ICD-10-CM | POA: Diagnosis not present

## 2023-08-20 MED ORDER — PROMETHAZINE-DM 6.25-15 MG/5ML PO SYRP: 5.0000 mL | ORAL_SOLUTION | Freq: Every evening | ORAL | 0 refills | Status: AC | PRN

## 2023-08-20 MED ORDER — BENZONATATE 100 MG PO CAPS: 100.0000 mg | ORAL_CAPSULE | Freq: Three times a day (TID) | ORAL | 0 refills | Status: AC

## 2023-08-20 NOTE — ED Triage Notes (Signed)
Reports symptoms x 4 days. States running a low grade temp of 100 nightly. States drainagel down back of throat causing cough to be worse at night.

## 2023-08-20 NOTE — ED Provider Notes (Signed)
[facility]    CSN: [id number] Arrival date & time: 08/20/23  1415      History   Chief Complaint Chief Complaint  Patient presents with   Cough   Nasal Congestion    HPI Natalie Osborn is a 62 y.o. female.   Natalie Osborn is a 62 y.o. female presenting for chief complaint of Cough and Nasal Congestion that started 4 days ago. Reports low grade fever nightly up to 100.0 that has responded well to tylenol OTC. Cough is dry. Reports sore throat that is worsened by swallowing and coughing. Denies chest pain, SOB, palpitations, N/V/D, abdominal pain, rash. Her whole family has been sick and they have been passing around viral illnesses. History of RA, receives infusion treatments. No history of chronic respiratory problems. Former smoker. Taking OTC medications without much relief.    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      Home Medications    Prior to Admission medications   Medication Sig Start Date End Date Taking? Authorizing Provider  benzonatate (TESSALON) 100 MG capsule Take 1 capsule (100 mg total) by mouth every 8 (eight) hours. 08/20/23  Yes Carlisle Beers, FNP  predniSONE (DELTASONE) 5 MG tablet Take 5 mg by mouth daily with breakfast.   Yes [provider]  promethazine-dextromethorphan (PROMETHAZINE-DM) 6.25-15 MG/5ML syrup Take 5 mLs by mouth at bedtime as needed for cough. 08/20/23  Yes Carlisle Beers, FNP  calcium carbonate 1250 MG capsule Take 1,250 mg by mouth 2 (two) times daily with a meal.    [provider]  ferrous fumarate (HEMOCYTE - 106 MG FE) 325 (106 FE) MG TABS tablet  Take 1 tablet by mouth.    [provider]  folic acid (FOLVITE) 1 MG tablet Take 2 mg by mouth 2 (two) times daily.    [provider]  MEROPENEM IV Inject into the vein.    [provider]  methotrexate (50 MG/ML) 1 g injection Inject 0.8 mg into the vein once a week.    [provider]  omeprazole (PRILOSEC) 40 MG capsule Take 40 mg by mouth daily.    [provider]    Family History Family History  Problem Relation Age of Onset   Diabetes Mother    Cancer Father    Other Brother        Crypto Meningitis    Social History Social History   Tobacco Use   Smoking status: Former    Current packs/day: 0.00    Average packs/day: 1 pack/day for 7.0 years (7.0 ttl pk-yrs)    Types: Cigarettes    Start date: 05/07/1987    Quit date: 05/06/1994    Years since quitting: 29.3   Smokeless tobacco: Never     Allergies   Patient has no known allergies.   Review of Systems Review of Systems  Respiratory:  Positive for cough.   Per HPI   Physical Exam Triage Vital Signs ED Triage Vitals [08/20/23 1533]  Encounter Vitals Group     BP 120/86     Systolic BP Percentile  Diastolic BP Percentile      Pulse Rate 72     Resp 20     Temp 98.4 F (36.9 C)     Temp Source Oral     SpO2 97 %     Weight 189 lb (85.7 kg)     Height      Head Circumference      Peak Flow      Pain Score 7     Pain Loc      Pain Education      Exclude from Growth Chart    No data found.  Updated Vital Signs BP 120/86 (BP Location: Right Arm)   Pulse 72   Temp 98.4 F (36.9 C) (Oral)   Resp 20   Wt 189 lb (85.7 kg)   SpO2 97%   BMI 31.45 kg/m   Visual Acuity Right Eye Distance:   Left Eye Distance:   Bilateral Distance:    Right Eye Near:   Left Eye Near:    Bilateral Near:     Physical Exam Vitals and nursing note reviewed.  Constitutional:      Appearance: She is not ill-appearing or toxic-appearing.  HENT:     Head:  Normocephalic and atraumatic.     Right Ear: Hearing, tympanic membrane, ear canal and external ear normal.     Left Ear: Hearing, tympanic membrane, ear canal and external ear normal.     Nose: Congestion present.     Mouth/Throat:     Lips: Pink.     Mouth: Mucous membranes are moist. No injury.     Tongue: No lesions. Tongue does not deviate from midline.     Palate: No mass and lesions.     Pharynx: Oropharynx is clear. Uvula midline. No pharyngeal swelling, oropharyngeal exudate, posterior oropharyngeal erythema or uvula swelling.     Tonsils: No tonsillar exudate or tonsillar abscesses.  Eyes:     General: Lids are normal. Vision grossly intact. Gaze aligned appropriately.     Extraocular Movements: Extraocular movements intact.     Conjunctiva/sclera: Conjunctivae normal.  Cardiovascular:     Rate and Rhythm: Normal rate and regular rhythm.     Heart sounds: Normal heart sounds, S1 normal and S2 normal.  Pulmonary:     Effort: Pulmonary effort is normal. No respiratory distress.     Breath sounds: Normal breath sounds and air entry.  Musculoskeletal:     Cervical back: Neck supple.  Lymphadenopathy:     Cervical: No cervical adenopathy.  Skin:    General: Skin is warm and dry.     Capillary Refill: Capillary refill takes less than 2 seconds.     Findings: No rash.  Neurological:     General: No focal deficit present.     Mental Status: She is alert and oriented to person, place, and time. Mental status is at baseline.     Cranial Nerves: No dysarthria or facial asymmetry.  Psychiatric:        Mood and Affect: Mood normal.        Speech: Speech normal.        Behavior: Behavior normal.        Thought Content: Thought content normal.        Judgment: Judgment normal.      UC Treatments / Results  Labs (all labs ordered are listed, but only abnormal results are displayed) Labs Reviewed  SARS CORONAVIRUS 2 (TAT 6-24 HRS)    EKG  Radiology No results  found.  Procedures Procedures (including critical care time)  Medications Ordered in UC Medications - No data to display  Initial Impression / Assessment and Plan / UC Course  I have reviewed the triage vital signs and the nursing notes.  Pertinent labs & imaging results that were available during my care of the patient were reviewed by me and considered in my medical decision making (see chart for details).   1. Viral URI with cough, at risk for infection due to immunosuppression Suspect viral URI, viral syndrome. Physical exam findings reassuring, vital signs hemodynamically stable. Low suspicion for pneumonia/acute cardiopulmonary abnormality, therefore deferred imaging of the chest. Advised supportive care, offered prescriptions for symptomatic relief.  Recommend continued use of OTC medications as needed, recommendations discussed with patient/caregiver and outlined in AVS.  Strep/viral testing: COVID testing pending, she is a candidate for antiviral.  Counseled patient on potential for adverse effects with medications prescribed/recommended today, strict ER and return-to-clinic precautions discussed, patient verbalized understanding.    Final Clinical Impressions(s) / UC Diagnoses   Final diagnoses:  Viral URI with cough  At risk for infection due to immunosuppression     Discharge Instructions      You have a viral illness which will improve on its own with rest, fluids, and medications to help with your symptoms. We discussed prescriptions that may help with your symptoms: tessalon perles every 8 hours, promethazine DM at bedtime as needed You may use over the counter medicines as needed: tylenol/motrin, mucinex, zyrtec, Flonase Two teaspoons of honey in 1 cup of warm water every 4-6 hours may help with throat pains. Humidifier in room at nighttime may help soothe cough (clean well daily).   For chest pain, shortness of breath, inability to keep food or fluids down  without vomiting, fever that does not respond to tylenol or motrin, or any other severe symptoms, please go to the ER for further evaluation. Return to urgent care as needed, otherwise follow-up with PCP.     ED Prescriptions     Medication Sig Dispense Auth. Provider   benzonatate (TESSALON) 100 MG capsule Take 1 capsule (100 mg total) by mouth every 8 (eight) hours. 21 capsule Carlisle Beers, FNP   promethazine-dextromethorphan (PROMETHAZINE-DM) 6.25-15 MG/5ML syrup Take 5 mLs by mouth at bedtime as needed for cough. 118 mL Carlisle Beers, FNP      PDMP not reviewed this encounter.   Carlisle Beers, [place] 08/20/23 1626

## 2023-08-20 NOTE — Discharge Instructions (Addendum)
You have a viral illness which will improve on its own with rest, fluids, and medications to help with your symptoms. We discussed prescriptions that may help with your symptoms: tessalon perles every 8 hours, promethazine DM at bedtime as needed You may use over the counter medicines as needed: tylenol/motrin, mucinex, zyrtec, Flonase Two teaspoons of honey in 1 cup of warm water every 4-6 hours may help with throat pains. Humidifier in room at nighttime may help soothe cough (clean well daily).   For chest pain, shortness of breath, inability to keep food or fluids down without vomiting, fever that does not respond to tylenol or motrin, or any other severe symptoms, please go to the ER for further evaluation. Return to urgent care as needed, otherwise follow-up with PCP.

## 2023-08-22 LAB — SARS CORONAVIRUS 2 (TAT 6-24 HRS): SARS Coronavirus 2: NEGATIVE

## 2023-09-01 DIAGNOSIS — R300 Dysuria: Secondary | ICD-10-CM | POA: Diagnosis not present

## 2023-10-19 DIAGNOSIS — M1990 Unspecified osteoarthritis, unspecified site: Secondary | ICD-10-CM | POA: Diagnosis not present

## 2023-10-19 DIAGNOSIS — M0589 Other rheumatoid arthritis with rheumatoid factor of multiple sites: Secondary | ICD-10-CM | POA: Diagnosis not present

## 2023-10-19 DIAGNOSIS — Z79899 Other long term (current) drug therapy: Secondary | ICD-10-CM | POA: Diagnosis not present

## 2023-10-19 DIAGNOSIS — M810 Age-related osteoporosis without current pathological fracture: Secondary | ICD-10-CM | POA: Diagnosis not present

## 2023-10-19 DIAGNOSIS — M79643 Pain in unspecified hand: Secondary | ICD-10-CM | POA: Diagnosis not present

## 2024-01-21 DIAGNOSIS — M79643 Pain in unspecified hand: Secondary | ICD-10-CM | POA: Diagnosis not present

## 2024-01-21 DIAGNOSIS — M1990 Unspecified osteoarthritis, unspecified site: Secondary | ICD-10-CM | POA: Diagnosis not present

## 2024-01-21 DIAGNOSIS — Z79899 Other long term (current) drug therapy: Secondary | ICD-10-CM | POA: Diagnosis not present

## 2024-01-21 DIAGNOSIS — M810 Age-related osteoporosis without current pathological fracture: Secondary | ICD-10-CM | POA: Diagnosis not present

## 2024-01-21 DIAGNOSIS — M0589 Other rheumatoid arthritis with rheumatoid factor of multiple sites: Secondary | ICD-10-CM | POA: Diagnosis not present

## 2024-04-26 DIAGNOSIS — M0589 Other rheumatoid arthritis with rheumatoid factor of multiple sites: Secondary | ICD-10-CM | POA: Diagnosis not present

## 2024-04-26 DIAGNOSIS — Z79899 Other long term (current) drug therapy: Secondary | ICD-10-CM | POA: Diagnosis not present

## 2024-04-26 DIAGNOSIS — M810 Age-related osteoporosis without current pathological fracture: Secondary | ICD-10-CM | POA: Diagnosis not present

## 2024-04-26 DIAGNOSIS — M1990 Unspecified osteoarthritis, unspecified site: Secondary | ICD-10-CM | POA: Diagnosis not present

## 2024-07-26 DIAGNOSIS — M810 Age-related osteoporosis without current pathological fracture: Secondary | ICD-10-CM | POA: Diagnosis not present

## 2024-07-26 DIAGNOSIS — M1990 Unspecified osteoarthritis, unspecified site: Secondary | ICD-10-CM | POA: Diagnosis not present

## 2024-07-26 DIAGNOSIS — M0589 Other rheumatoid arthritis with rheumatoid factor of multiple sites: Secondary | ICD-10-CM | POA: Diagnosis not present

## 2024-07-26 DIAGNOSIS — Z79899 Other long term (current) drug therapy: Secondary | ICD-10-CM | POA: Diagnosis not present
